# Patient Record
Sex: Female | Born: 1935 | Race: White | Hispanic: No | Smoking: Never smoker
Health system: Southern US, Community
[De-identification: ages and names within clinical notes are randomized; demographics above are authoritative.]

## PROBLEM LIST (undated history)

## (undated) DIAGNOSIS — M25569 Pain in unspecified knee: Secondary | ICD-10-CM

## (undated) DIAGNOSIS — I10 Essential (primary) hypertension: Secondary | ICD-10-CM

## (undated) HISTORY — PX: NO PAST SURGERIES: SHX2092

---

## 2017-07-09 ENCOUNTER — Ambulatory Visit
Admission: RE | Admit: 2017-07-09 | Discharge: 2017-07-09 | Disposition: A | Discharge status: Home / Self Care | Payer: Medicare Other | Source: Ambulatory Visit | Attending: Family Medicine | Admitting: Family Medicine

## 2017-07-09 ENCOUNTER — Other Ambulatory Visit: Payer: Self-pay | Admitting: Family Medicine

## 2017-07-09 DIAGNOSIS — M25561 Pain in right knee: Secondary | ICD-10-CM | POA: Diagnosis present

## 2017-07-09 DIAGNOSIS — M25461 Effusion, right knee: Secondary | ICD-10-CM | POA: Diagnosis not present

## 2017-07-09 DIAGNOSIS — M170 Bilateral primary osteoarthritis of knee: Secondary | ICD-10-CM | POA: Insufficient documentation

## 2017-07-09 DIAGNOSIS — M8580 Other specified disorders of bone density and structure, unspecified site: Secondary | ICD-10-CM | POA: Insufficient documentation

## 2017-07-09 DIAGNOSIS — G8929 Other chronic pain: Secondary | ICD-10-CM

## 2017-07-09 DIAGNOSIS — M25562 Pain in left knee: Principal | ICD-10-CM

## 2018-06-13 ENCOUNTER — Emergency Department: Payer: Medicare Other

## 2018-06-13 ENCOUNTER — Encounter: Payer: Self-pay | Admitting: Emergency Medicine

## 2018-06-13 ENCOUNTER — Other Ambulatory Visit: Payer: Self-pay

## 2018-06-13 ENCOUNTER — Inpatient Hospital Stay: Payer: Medicare Other

## 2018-06-13 ENCOUNTER — Inpatient Hospital Stay
Admission: EM | Admit: 2018-06-13 | Discharge: 2018-06-17 | DRG: 470 | Disposition: A | Discharge status: Skilled Nursing Facility | Payer: Medicare Other | Attending: Internal Medicine | Admitting: Internal Medicine

## 2018-06-13 DIAGNOSIS — F0390 Unspecified dementia without behavioral disturbance: Secondary | ICD-10-CM | POA: Diagnosis present

## 2018-06-13 DIAGNOSIS — Z781 Physical restraint status: Secondary | ICD-10-CM | POA: Diagnosis not present

## 2018-06-13 DIAGNOSIS — H9190 Unspecified hearing loss, unspecified ear: Secondary | ICD-10-CM | POA: Diagnosis present

## 2018-06-13 DIAGNOSIS — G8929 Other chronic pain: Secondary | ICD-10-CM | POA: Diagnosis present

## 2018-06-13 DIAGNOSIS — R7301 Impaired fasting glucose: Secondary | ICD-10-CM | POA: Diagnosis present

## 2018-06-13 DIAGNOSIS — N390 Urinary tract infection, site not specified: Secondary | ICD-10-CM | POA: Diagnosis present

## 2018-06-13 DIAGNOSIS — Z836 Family history of other diseases of the respiratory system: Secondary | ICD-10-CM | POA: Diagnosis not present

## 2018-06-13 DIAGNOSIS — M25552 Pain in left hip: Secondary | ICD-10-CM | POA: Diagnosis present

## 2018-06-13 DIAGNOSIS — S72009A Fracture of unspecified part of neck of unspecified femur, initial encounter for closed fracture: Secondary | ICD-10-CM

## 2018-06-13 DIAGNOSIS — S72012A Unspecified intracapsular fracture of left femur, initial encounter for closed fracture: Secondary | ICD-10-CM | POA: Diagnosis present

## 2018-06-13 DIAGNOSIS — E860 Dehydration: Secondary | ICD-10-CM | POA: Diagnosis present

## 2018-06-13 DIAGNOSIS — M25511 Pain in right shoulder: Secondary | ICD-10-CM | POA: Diagnosis present

## 2018-06-13 DIAGNOSIS — I10 Essential (primary) hypertension: Secondary | ICD-10-CM | POA: Diagnosis present

## 2018-06-13 DIAGNOSIS — W19XXXA Unspecified fall, initial encounter: Secondary | ICD-10-CM

## 2018-06-13 DIAGNOSIS — M1612 Unilateral primary osteoarthritis, left hip: Secondary | ICD-10-CM | POA: Diagnosis present

## 2018-06-13 DIAGNOSIS — Y92003 Bedroom of unspecified non-institutional (private) residence as the place of occurrence of the external cause: Secondary | ICD-10-CM | POA: Diagnosis not present

## 2018-06-13 DIAGNOSIS — Z79899 Other long term (current) drug therapy: Secondary | ICD-10-CM

## 2018-06-13 DIAGNOSIS — G8918 Other acute postprocedural pain: Secondary | ICD-10-CM

## 2018-06-13 DIAGNOSIS — W1809XA Striking against other object with subsequent fall, initial encounter: Secondary | ICD-10-CM | POA: Diagnosis present

## 2018-06-13 DIAGNOSIS — I447 Left bundle-branch block, unspecified: Secondary | ICD-10-CM | POA: Diagnosis present

## 2018-06-13 DIAGNOSIS — S72002A Fracture of unspecified part of neck of left femur, initial encounter for closed fracture: Secondary | ICD-10-CM | POA: Diagnosis present

## 2018-06-13 HISTORY — DX: Pain in unspecified knee: M25.569

## 2018-06-13 HISTORY — DX: Essential (primary) hypertension: I10

## 2018-06-13 LAB — HEMOGLOBIN A1C
Hgb A1c MFr Bld: 5.2 % (ref 4.8–5.6)
Mean Plasma Glucose: 102.54 mg/dL

## 2018-06-13 LAB — BASIC METABOLIC PANEL
Anion gap: 10 (ref 5–15)
BUN: 29 mg/dL — AB (ref 8–23)
CALCIUM: 9.2 mg/dL (ref 8.9–10.3)
CO2: 23 mmol/L (ref 22–32)
CREATININE: 1.01 mg/dL — AB (ref 0.44–1.00)
Chloride: 104 mmol/L (ref 98–111)
GFR calc Af Amer: 52 mL/min — ABNORMAL LOW (ref >60)
GFR, EST NON AFRICAN AMERICAN: 44 mL/min — AB (ref >60)
Glucose, Bld: 140 mg/dL — ABNORMAL HIGH (ref 70–99)
Potassium: 4.2 mmol/L (ref 3.5–5.1)
SODIUM: 137 mmol/L (ref 135–145)

## 2018-06-13 LAB — CBC
HCT: 37.2 % (ref 36.0–46.0)
Hemoglobin: 12.1 g/dL (ref 12.0–15.0)
MCH: 31.4 pg (ref 26.0–34.0)
MCHC: 32.5 g/dL (ref 30.0–36.0)
MCV: 96.6 fL (ref 80.0–100.0)
NRBC: 0 % (ref 0.0–0.2)
PLATELETS: 225 10*3/uL (ref 150–400)
RBC: 3.85 MIL/uL — AB (ref 3.87–5.11)
RDW: 12.7 % (ref 11.5–15.5)
WBC: 12.4 10*3/uL — ABNORMAL HIGH (ref 4.0–10.5)

## 2018-06-13 LAB — TYPE AND SCREEN
ABO/RH(D): O POS
Antibody Screen: NEGATIVE

## 2018-06-13 LAB — PROTIME-INR
INR: 1
PROTHROMBIN TIME: 13.1 s (ref 11.4–15.2)

## 2018-06-13 LAB — APTT: aPTT: 38 seconds — ABNORMAL HIGH (ref 24–36)

## 2018-06-13 MED ORDER — SODIUM CHLORIDE 0.9 % IV BOLUS
1000.0000 mL | Freq: Once | INTRAVENOUS | Status: AC
  Administered 2018-06-13: 1000 mL via INTRAVENOUS

## 2018-06-13 MED ORDER — MORPHINE SULFATE (PF) 2 MG/ML IV SOLN
1.0000 mg | INTRAVENOUS | Status: DC | PRN
  Administered 2018-06-13: 1 mg via INTRAVENOUS
  Filled 2018-06-13: qty 1

## 2018-06-13 MED ORDER — CEFAZOLIN SODIUM-DEXTROSE 1-4 GM/50ML-% IV SOLN
1.0000 g | Freq: Once | INTRAVENOUS | Status: AC
  Administered 2018-06-14: 1 g via INTRAVENOUS
  Filled 2018-06-13: qty 50

## 2018-06-13 MED ORDER — OXYCODONE HCL 5 MG PO TABS
5.0000 mg | ORAL_TABLET | ORAL | Status: DC | PRN
  Administered 2018-06-16 – 2018-06-17 (×3): 5 mg via ORAL
  Filled 2018-06-13 (×3): qty 1

## 2018-06-13 MED ORDER — ONDANSETRON HCL 4 MG/2ML IJ SOLN: 4.0000 mg | Freq: Four times a day (QID) | INTRAMUSCULAR | Status: DC | PRN

## 2018-06-13 MED ORDER — OCUVITE-LUTEIN PO CAPS
1.0000 | ORAL_CAPSULE | Freq: Every day | ORAL | Status: DC
  Administered 2018-06-16 – 2018-06-17 (×2): 1 via ORAL
  Filled 2018-06-13 (×4): qty 1

## 2018-06-13 MED ORDER — ACETAMINOPHEN 650 MG RE SUPP: 650.0000 mg | Freq: Four times a day (QID) | RECTAL | Status: DC | PRN

## 2018-06-13 MED ORDER — ONDANSETRON HCL 4 MG PO TABS: 4.0000 mg | ORAL_TABLET | Freq: Four times a day (QID) | ORAL | Status: DC | PRN

## 2018-06-13 MED ORDER — FENTANYL CITRATE (PF) 100 MCG/2ML IJ SOLN
25.0000 ug | INTRAMUSCULAR | Status: DC | PRN
  Administered 2018-06-13: 25 ug via INTRAVENOUS
  Filled 2018-06-13: qty 2

## 2018-06-13 MED ORDER — ACETAMINOPHEN 325 MG PO TABS
650.0000 mg | ORAL_TABLET | Freq: Four times a day (QID) | ORAL | Status: DC | PRN
  Administered 2018-06-13 – 2018-06-15 (×2): 650 mg via ORAL
  Filled 2018-06-13 (×3): qty 2

## 2018-06-13 MED ORDER — SODIUM CHLORIDE 0.9 % IV SOLN
INTRAVENOUS | Status: DC
  Administered 2018-06-13 – 2018-06-15 (×2): via INTRAVENOUS

## 2018-06-13 MED ORDER — ATENOLOL 25 MG PO TABS
25.0000 mg | ORAL_TABLET | Freq: Every day | ORAL | Status: DC
  Administered 2018-06-14 – 2018-06-17 (×3): 25 mg via ORAL
  Filled 2018-06-13 (×4): qty 1

## 2018-06-13 NOTE — ED Provider Notes (Signed)
[facility] Emergency Department Provider Note  ____________________________________________  Time seen: Approximately 2:00 PM  I have reviewed the triage vital signs and the nursing notes.   HISTORY  Chief Complaint Fall    HPI Jill Henderson is a 84 y.o. female with a history of HTN, not anticoagulated and not on aspirin, presenting with left hip pain after mechanical fall.  The patient lives independently with a daytime home health aide.  She got up prior to her aide arriving, was using her walker to get around the bed when the walker got caught into the leg of the bed and she fell onto her left side.  She did not hit her head or lose consciousness but was unable to stand.  She has pain in the left hip.  Since she has been here, she has developed some pain in the right shoulder; at baseline she has severe osteoarthritis with pain in the shoulder and feels it is similar to that.  She denies any headache, nausea or vomiting.  She has not had any chest pain, shortness of breath, lightheadedness or syncope, palpitations, numbness tingling or weakness, visual changes, speech changes or mental status changes.  She has not had any recent illnesses.  She has not eaten since yesterday.  Past Medical History:  Diagnosis Date  . Hypertension     There are no active problems to display for this patient.   History reviewed. No pertinent surgical history.    Allergies Patient has no known allergies.  No family history on file.  Social History Social History   Tobacco Use  . Smoking status: Never Smoker  . Smokeless tobacco: Never Used  Substance Use Topics  . Alcohol use: Never    Frequency: Never  . Drug use: Never    Review of Systems Constitutional: No fever/chills.  Has a mechanical fall.  No loss of consciousness, lightheadedness or syncope. Eyes: No visual changes.  No blurred or double vision. ENT: No sore throat. No congestion or  rhinorrhea. Cardiovascular: Denies chest pain. Denies palpitations. Respiratory: Denies shortness of breath.  No cough. Gastrointestinal: No abdominal pain.  No nausea, no vomiting.  No diarrhea.  No constipation. Genitourinary: Negative for dysuria. Musculoskeletal: Negative for back pain.  Chronic right shoulder pain, worse at this time.  Positive left hip pain.  No neck pain. Skin: Negative for rash. Neurological: Negative for headaches. No focal numbness, tingling or weakness.     ____________________________________________   PHYSICAL EXAM:  VITAL SIGNS: ED Triage Vitals [06/13/18 1247]  Enc Vitals Group     BP 107/76     Pulse Rate (!) 54     Resp 16     Temp      Temp src      SpO2 93 %     Weight 123 lb (55.8 kg)     Height      Head Circumference      Peak Flow      Pain Score      Pain Loc      Pain Edu?      Excl. in GC?     Constitutional: Alert and oriented. Answers questions appropriately.  GCS is 15. Eyes: Conjunctivae are normal.  EOMI. PERRLA.  No scleral icterus.  No raccoon eyes. Head: Atraumatic.  No battle sign. Nose: No congestion/rhinnorhea.  No swelling over the nose or septal hematoma. Mouth/Throat: Mucous membranes are moist.  No dental injury or malocclusion. Neck: No stridor.  Some neck  stiffness which is appropriate for age.  No midline C-spine tenderness to palpation, step-offs or deformities. Cardiovascular: Normal rate, regular rhythm. No murmurs, rubs or gallops.  Respiratory: Normal respiratory effort.  No accessory muscle use or retractions. Lungs CTAB.  No wheezes, rales or ronchi. Gastrointestinal: Soft, nontender and nondistended.  No guarding or rebound.  No peritoneal signs. Musculoskeletal: Pelvis is stable.  Full range of motion of the right ankle, knee and hip without pain.  Tenderness palpation over the left greater trochanter.  Normal DP and PT pulses bilaterally.  Normal sensation to light touch bilaterally.  No LE edema. No  ttp in the calves or palpable cords.  Negative Homan's sign. Neurologic: alert.  Speech is clear.  Face and smile are symmetric.  EOMI.  Moves all extremities well. Skin:  Skin is warm, dry and intact. No rash noted. Psychiatric: Mood and affect are normal. Speech and behavior are normal.  Normal judgement.  ____________________________________________   LABS (all labs ordered are listed, but only abnormal results are displayed)  Labs Reviewed  CBC  BASIC METABOLIC PANEL  PROTIME-INR  APTT  TYPE AND SCREEN   ____________________________________________  EKG  ED ECG REPORT I, Anne-Caroline Sharma Covert, the attending physician, personally viewed and interpreted this ECG.   Date: 06/13/2018  EKG Time: 1303  Rate: 57  Rhythm: sinus bradycardia; LBBB  Axis: leftward  Intervals:none  ST&T Change: No STEMI  ____________________________________________  RADIOLOGY  Dg Chest 1 View  Result Date: 06/13/2018 CLINICAL DATA:  Pain after fall. EXAM: CHEST  1 VIEW COMPARISON:  None. FINDINGS: Borderline cardiomegaly with moderate aortic atherosclerosis. The aorta is uncoiled in appearance. Coarse interstitial lung markings are noted bilaterally without alveolar consolidation, pulmonary consolidation, effusion or pneumothorax. Osteoarthritis with remodeled appearance of the right humeral head and glenoid. Osteoarthritis of the included AC joints. No acute rib fracture. IMPRESSION: Cardiomegaly with tortuous atherosclerotic aorta. No active pulmonary disease. Electronically Signed   By: Tollie Eth M.D.   On: 06/13/2018 13:41   Dg Hip Unilat With Pelvis 2-3 Views Left  Result Date: 06/13/2018 CLINICAL DATA:  Left hip pain after fall EXAM: DG HIP (WITH OR WITHOUT PELVIS) 2-3V LEFT COMPARISON:  None. FINDINGS: Varus angulated subcapital left femoral neck fracture is identified. No joint dislocation. Degenerative joint space narrowing of both hips left greater than right. Lower lumbar facet arthrosis  at L5-S1. No acute pelvic fracture. Increased stool burden noted within the rectum. IMPRESSION: 1. Varus angulated subcapital left femoral neck fracture with bilateral hip joint osteoarthritis. 2. Facet arthrosis L5-S1 bilaterally. 3. No acute pelvic fracture. Electronically Signed   By: Tollie Eth M.D.   On: 06/13/2018 13:40    ____________________________________________   PROCEDURES  Procedure(s) performed: None  Procedures  Critical Care performed: No ____________________________________________   INITIAL IMPRESSION / ASSESSMENT AND PLAN / ED COURSE  Pertinent labs & imaging results that were available during my care of the patient were reviewed by me and considered in my medical decision making (see chart for details).  84 y.o. female status post mechanical fall with left hip pain.  Overall, the patient is hemodynamically stable; she does have some bradycardia and we will do a EKG for further evaluation.  The patient's x-ray does show a left femoral neck fracture and I have put a consult into Dr. Rosita Kea for evaluation.  The patient will be admitted to the hospitalist.  ____________________________________________  FINAL CLINICAL IMPRESSION(S) / ED DIAGNOSES  Final diagnoses:  Closed fracture of neck of left femur, initial  encounter [facility])  Acute pain of right shoulder  Fall, initial encounter         NEW MEDICATIONS STARTED DURING THIS VISIT:  New Prescriptions   No medications on file      Rockne Menghini, MD 06/13/18 1514

## 2018-06-13 NOTE — ED Triage Notes (Signed)
Pt to ED via [facility] from home for fall. Pt was found by aid this morning when she went to check on pt. Pt has obvious shortening and rotation of the left leg. Pts Vitals per EMS as follows  BP: 150/76 HR: 58- regular SpO2: 97% on room air CBG: 182

## 2018-06-13 NOTE — Clinical Social Work Placement (Signed)
   CLINICAL SOCIAL WORK PLACEMENT  NOTE  Date:  06/13/2018  Patient Details  Name: Jill Henderson MRN: [id number] Date of Birth: 03-31-1917  Clinical Social Work is seeking post-discharge placement for this patient at the Skilled  Nursing Facility level of care (*CSW will initial, date and re-position this form in  chart as items are completed):  Yes   Patient/family provided with [place] Clinical Social Work Department's list of facilities offering this level of care within the geographic area requested by the patient (or if unable, by the patient's family).  Yes   Patient/family informed of their freedom to choose among providers that offer the needed level of care, that participate in Medicare, Medicaid or managed care program needed by the patient, have an available bed and are willing to accept the patient.  Yes   Patient/family informed of [facility]'s ownership interest in [facility] and [facility], as well as of the fact that they are under no obligation to receive care at these facilities.  PASRR submitted to EDS on 06/13/18     PASRR number received on 06/13/18     Existing PASRR number confirmed on       FL2 transmitted to all facilities in geographic area requested by pt/family on 06/13/18     FL2 transmitted to all facilities within larger geographic area on       Patient informed that his/her managed care company has contracts with or will negotiate with certain facilities, including the following:            Patient/family informed of bed offers received.  Patient chooses bed at       Physician recommends and patient chooses bed at      Patient to be transferred to   on  .  Patient to be transferred to facility by       Patient family notified on   of transfer.  Name of family member notified:        PHYSICIAN       Additional Comment:    _______________________________________________ Ruthe Mannan, LCSWA 06/13/2018, 5:08 PM

## 2018-06-13 NOTE — Clinical Social Work Note (Signed)
Clinical Social Work Assessment  Patient Details  Name: Jill Henderson MRN: [id number] Date of Birth: 05/18/1917  Date of referral:  06/13/18               Reason for consult:  Facility Placement                Permission sought to share information with:  Oceanographer granted to share information::  Yes, Verbal Permission Granted  Name::      Skilled Nursing Facility   Agency::   [place]   Relationship::     Contact Information:     Housing/Transportation Living arrangements for the past 2 months:  Single Family Home Source of Information:  Other (Comment Required)(granddaughter ) Patient Interpreter Needed:  None Criminal Activity/Legal Involvement Pertinent to Current Situation/Hospitalization:  No - Comment as needed Significant Relationships:  Other Family Members Lives with:  Self Do you feel safe going back to the place where you live?    Need for family participation in patient care:  Yes (Comment)  Care giving concerns:  Patient lives alone in [place].    Social Worker assessment / plan:  Visual merchandiser (CSW) reviewed chart and noted that patient has a hip fracture. Ortho consult is pending. CSW attempted to meet with patient alone at bedside however she was pleasantly confused. CSW contacted patient's granddaughter Jill Henderson. Per Jill Henderson she lives in [place] and is driving up to [place] [place]. Per Jill Henderson she and her 2 brothers Jill Henderson and Jill Henderson will make decisions for patient. Per Jill Henderson patient lives alone in [place] and has private duty caregivers that come in during the day. Per Jill Henderson all her grandchildren live in [place]. CSW explained that PT will evaluate patient and make a recommendation of home health or SNF. CSW also explained that medicare requires a 3 night qualifying inpatient stay in the hospital in order to pay for SNF. Jill Henderson verbalized her understanding. CSW will continue to follow and assist as needed.   Employment status:  Disabled  (Comment on whether or not currently receiving Disability) Insurance information:  Medicare PT Recommendations:  Not assessed at this time Information / Referral to community resources:  Skilled Nursing Facility  Patient/Family's Response to care:  Per granddaughter she is waiting on Ortho surgeon's recommendations.   Patient/Family's Understanding of and Emotional Response to Diagnosis, Current Treatment, and Prognosis:  Patient and her granddaughter were very pleasant and thanked CSW for assistance.   Emotional Assessment Appearance:  Appears stated age Attitude/Demeanor/Rapport:    Affect (typically observed):  Unable to Assess Orientation:  Oriented to Self, Fluctuating Orientation (Suspected and/or reported Sundowners) Alcohol / Substance use:  Not Applicable Psych involvement (Current and /or in the community):  No (Comment)  Discharge Needs  Concerns to be addressed:  Discharge Planning Concerns Readmission within the last 30 days:  No Current discharge risk:  Dependent with Mobility Barriers to Discharge:  Continued Medical Work up   [employer], Jill Crocker, LCSW 06/13/2018, 5:18 PM

## 2018-06-13 NOTE — Consult Note (Signed)
Patient is a 84 year old female who lives alone who apparently suffered a fall today at home when getting out of bed.  She normally uses a walker and she says it just flipped over and she fell onto her left side.  She has been living by herself with aides coming in.  She normally uses a walker to get around her home.  On exam she is able to flex extend the toes on the left side and the left leg is shortened and externally rotated.  Skin is intact around the hip without ecchymosis.  Radiographs show completely displaced femoral neck fracture with moderate arthritis  Impression is displaced femoral neck fracture  Daryl Eastern is left hip hemiarthroplasty.  Risks and possible complications discussed.

## 2018-06-13 NOTE — Progress Notes (Signed)
Patient ID: Jill Henderson, female   DOB: 11-Jan-1917, 84 y.o.   MRN: [id number]  ACP note  Patient present.  Permission to speak in front of neighbor and caregiver.  Diagnosis: Acute left hip fracture, hypertension, dehydration and impaired fasting glucose.  CODE STATUS discussed.  Patient wishes to be a full code at this time.  She would not want to be on a machine long-term.  Took a while to talk about this secondary to the patient's poor hearing  Plan.  Orthopedic surgery consultation for hip fracture repair.  Pain control.  Most likely patient will end up needing rehab.  Time spent on ACP discussion 17 minutes Dr. Alford Highland

## 2018-06-13 NOTE — NC FL2 (Signed)
  [place] MEDICAID FL2 LEVEL OF CARE SCREENING TOOL     IDENTIFICATION  Patient Name: Jill Henderson Birthdate: May 03, 1917 Sex: female Admission Date (Current Location): 06/13/2018  [place] and [place] Number:  Chiropodist and Address:  [facility], [address] [place], [place] [address]      Provider Number: [id number]  Attending Physician Name and Address:  Alford Highland, MD  Relative Name and Phone Number:  Cristal Ford- granddaughter [phone]    Current Level of Care: Hospital Recommended Level of Care: Skilled Nursing Facility Prior Approval Number:    Date Approved/Denied:   PASRR Number: [id number]  Discharge Plan: SNF    Current Diagnoses: Patient Active Problem List   Diagnosis Date Noted  . Closed left hip fracture (HCC) 06/13/2018    Orientation RESPIRATION BLADDER Height & Weight     Self  Normal Incontinent Weight: 123 lb (55.8 kg) Height:     BEHAVIORAL SYMPTOMS/MOOD NEUROLOGICAL BOWEL NUTRITION STATUS  (none) (none) Continent Diet(NPO to be advanced )  AMBULATORY STATUS COMMUNICATION OF NEEDS Skin   Extensive Assist Verbally Normal                       Personal Care Assistance Level of Assistance  Bathing, Feeding, Dressing Bathing Assistance: Limited assistance Feeding assistance: Independent Dressing Assistance: Limited assistance     Functional Limitations Info  Sight, Hearing, Speech Sight Info: Adequate Hearing Info: Adequate Speech Info: Adequate    SPECIAL CARE FACTORS FREQUENCY  PT (By licensed PT), OT (By licensed OT)     PT Frequency: 5 OT Frequency: 5            Contractures Contractures Info: Not present    Additional Factors Info  Code Status, Allergies Code Status Info: Full Code  Allergies Info: NKA           Current Medications (06/13/2018):  This is the current hospital active medication list Current Facility-Administered Medications  Medication Dose  Route Frequency Provider Last Rate Last Dose  . 0.9 %  sodium chloride infusion   Intravenous Continuous Alford Highland, MD 20 mL/hr at 06/13/18 1602    . acetaminophen (TYLENOL) tablet 650 mg  650 mg Oral Q6H PRN Alford Highland, MD       Or  . acetaminophen (TYLENOL) suppository 650 mg  650 mg Rectal Q6H PRN Alford Highland, MD      . Melene Muller ON 06/14/2018] atenolol (TENORMIN) tablet 25 mg  25 mg Oral Daily Wieting, Richard, MD      . morphine 2 MG/ML injection 1 mg  1 mg Intravenous Q3H PRN Alford Highland, MD   1 mg at 06/13/18 1615  . multivitamin-lutein (OCUVITE-LUTEIN) capsule 1 capsule  1 capsule Oral Daily Wieting, Richard, MD      . ondansetron [facility]) tablet 4 mg  4 mg Oral Q6H PRN Wieting, Richard, MD       Or  . ondansetron (ZOFRAN) injection 4 mg  4 mg Intravenous Q6H PRN Wieting, Richard, MD      . oxyCODONE (Oxy IR/ROXICODONE) immediate release tablet 5 mg  5 mg Oral Q4H PRN Alford Highland, MD         Discharge Medications: Please see discharge summary for a list of discharge medications.  Relevant Imaging Results:  Relevant Lab Results:   Additional Information SSN: [id number]  Ruthe Mannan, [place]

## 2018-06-13 NOTE — H&P (Signed)
[facility] [place] [facility]   PATIENT NAME: Jill Henderson    MR#:  [id number]  DATE OF BIRTH:  23-May-1917  DATE OF ADMISSION:  06/13/2018  PRIMARY CARE PHYSICIAN: Oswaldo Conroy, MD   REQUESTING/REFERRING PHYSICIAN: Dr Virgilio Frees  CHIEF COMPLAINT:   Chief Complaint  Patient presents with  . Fall    HISTORY OF PRESENT ILLNESS:  Jill Henderson  is a 84 y.o. female presents after a fall today.  She states that she got up earlier and got out of bed and her knee was hurting and she was trying to use her walker and the walker hit the edge of the bed and she fell to the ground.  In the ER she was found to have a hip fracture.  Patient having severe pain in the left hip.  She lives alone.  She does have caregivers.  No complaints of chest pain or shortness of breath.  PAST MEDICAL HISTORY:   Past Medical History:  Diagnosis Date  . Hypertension   . Knee pain     PAST SURGICAL HISTORY:   Past Surgical History:  Procedure Laterality Date  . NO PAST SURGERIES      SOCIAL HISTORY:   Social History   Tobacco Use  . Smoking status: Never Smoker  . Smokeless tobacco: Never Used  Substance Use Topics  . Alcohol use: Never    Frequency: Never    FAMILY HISTORY:   Family History  Problem Relation Age of Onset  . Other Mother        Influenza    DRUG ALLERGIES:  No Known Allergies  REVIEW OF SYSTEMS:  CONSTITUTIONAL: No fever, fatigue or weakness.  EYES: No blurred or double vision.  Wears glasses EARS, NOSE, AND THROAT: No tinnitus or ear pain. No sore throat.  Poor hearing RESPIRATORY: No cough, shortness of breath, wheezing or hemoptysis.  CARDIOVASCULAR: No chest pain, orthopnea, edema.  GASTROINTESTINAL: No nausea, vomiting, diarrhea or abdominal pain. No blood in bowel movements GENITOURINARY: No dysuria, hematuria.  ENDOCRINE: No polyuria, nocturia,  HEMATOLOGY: No anemia, easy bruising or bleeding SKIN: No rash or  lesion. MUSCULOSKELETAL: Left hip pain.  Some shoulder pain NEUROLOGIC: No tingling, numbness, weakness.  PSYCHIATRY: No anxiety or depression.   MEDICATIONS AT HOME:   Prior to Admission medications   Medication Sig Start Date End Date Taking? Authorizing Provider  acetaminophen (TYLENOL 8 HOUR ARTHRITIS PAIN) 650 MG CR tablet Take 650 mg by mouth 2 (two) times daily.   Yes [provider]  amLODipine (NORVASC) 10 MG tablet Take 10 mg by mouth daily.   Yes [provider]  atenolol (TENORMIN) 100 MG tablet Take 100 mg by mouth daily.   Yes [provider]  Glucosamine-Chondroitin (OSTEO BI-FLEX REGULAR STRENGTH) 250-200 MG TABS Take 1 tablet by mouth daily.   Yes [provider]  Multiple Vitamins-Minerals (PRESERVISION AREDS PO) Take 1 tablet by mouth daily.   Yes [provider]      VITAL SIGNS:  Blood pressure 107/76, pulse (!) 54, resp. rate 16, weight 55.8 kg, SpO2 93 %.  PHYSICAL EXAMINATION:  GENERAL:  84 y.o.-year-old patient lying in the bed with no acute distress.  EYES: Pupils equal, round, reactive to light and accommodation. No scleral icterus. Extraocular muscles intact.  HEENT: Head atraumatic, normocephalic. Oropharynx and nasopharynx clear.  NECK:  Supple, no jugular venous distention. No thyroid enlargement, no tenderness.  LUNGS: Normal breath sounds bilaterally, no wheezing, rales,rhonchi or  crepitation. No use of accessory muscles of respiration.  CARDIOVASCULAR: S1, S2 normal. No murmurs, rubs, or gallops.  ABDOMEN: Soft, nontender, nondistended. Bowel sounds present. No organomegaly or mass.  EXTREMITIES: 2+ pedal edema.  No cyanosis, or clubbing.  Left hip shortened and externally rotated. NEUROLOGIC: Cranial nerves II through XII are intact. Sensation intact. Gait not checked.  PSYCHIATRIC: The patient is alert and answers questions appropriately.  Patient has difficulty hearing so I think that is part of the issue.   SKIN: No rash, lesion, or ulcer.   LABORATORY PANEL:   CBC Recent Labs  Lab 06/13/18 1254  WBC 12.4*  HGB 12.1  HCT 37.2  PLT 225   ------------------------------------------------------------------------------------------------------------------  Chemistries  Recent Labs  Lab 06/13/18 1254  NA 137  K 4.2  CL 104  CO2 23  GLUCOSE 140*  BUN 29*  CREATININE 1.01*  CALCIUM 9.2   ------------------------------------------------------------------------------------------------------------------    RADIOLOGY:  Dg Chest 1 View  Result Date: 06/13/2018 CLINICAL DATA:  Pain after fall. EXAM: CHEST  1 VIEW COMPARISON:  None. FINDINGS: Borderline cardiomegaly with moderate aortic atherosclerosis. The aorta is uncoiled in appearance. Coarse interstitial lung markings are noted bilaterally without alveolar consolidation, pulmonary consolidation, effusion or pneumothorax. Osteoarthritis with remodeled appearance of the right humeral head and glenoid. Osteoarthritis of the included AC joints. No acute rib fracture. IMPRESSION: Cardiomegaly with tortuous atherosclerotic aorta. No active pulmonary disease. Electronically Signed   By: Tollie Eth M.D.   On: 06/13/2018 13:41   Dg Hip Unilat With Pelvis 2-3 Views Left  Result Date: 06/13/2018 CLINICAL DATA:  Left hip pain after fall EXAM: DG HIP (WITH OR WITHOUT PELVIS) 2-3V LEFT COMPARISON:  None. FINDINGS: Varus angulated subcapital left femoral neck fracture is identified. No joint dislocation. Degenerative joint space narrowing of both hips left greater than right. Lower lumbar facet arthrosis at L5-S1. No acute pelvic fracture. Increased stool burden noted within the rectum. IMPRESSION: 1. Varus angulated subcapital left femoral neck fracture with bilateral hip joint osteoarthritis. 2. Facet arthrosis L5-S1 bilaterally. 3. No acute pelvic fracture. Electronically Signed   By: Tollie Eth M.D.   On: 06/13/2018 13:40    EKG:   Ordered by  me  IMPRESSION AND PLAN:   1.  Preoperative evaluation for left hip fracture closed initial evaluation.  No contraindications to surgery at this time.  Patient not having any chest pain or shortness of breath. 2.  Hypertension.  Blood pressure on the lower side.  Hold Norvasc and cut back the dose of atenolol. 3.  Dehydration give very gentle IV fluids. 4.  Impaired fasting glucose check a hemoglobin A1c   All the records are reviewed and case discussed with ED provider. Management plans discussed with the patient, family and they are in agreement.  CODE STATUS: full code  TOTAL TIME TAKING CARE OF THIS PATIENT: 50 minutes.    Alford Highland M.D on 06/13/2018 at 2:44 PM  Between 7am to 6pm - Pager - [phone]  After 6pm call admission pager [phone]  [facility] Office  [phone]  CC: Primary care physician; Oswaldo Conroy, MD

## 2018-06-14 ENCOUNTER — Encounter: Payer: Self-pay | Admitting: Anesthesiology

## 2018-06-14 ENCOUNTER — Inpatient Hospital Stay: Payer: Medicare Other | Admitting: Anesthesiology

## 2018-06-14 ENCOUNTER — Inpatient Hospital Stay: Payer: Medicare Other

## 2018-06-14 ENCOUNTER — Encounter
Admission: EM | Disposition: A | Discharge status: Skilled Nursing Facility | Payer: Self-pay | Source: Home / Self Care | Attending: Internal Medicine

## 2018-06-14 HISTORY — PX: HIP ARTHROPLASTY: SHX981

## 2018-06-14 LAB — CBC
HEMATOCRIT: 35.4 % — AB (ref 36.0–46.0)
HEMOGLOBIN: 11.8 g/dL — AB (ref 12.0–15.0)
MCH: 31.9 pg (ref 26.0–34.0)
MCHC: 33.3 g/dL (ref 30.0–36.0)
MCV: 95.7 fL (ref 80.0–100.0)
NRBC: 0 % (ref 0.0–0.2)
Platelets: 196 10*3/uL (ref 150–400)
RBC: 3.7 MIL/uL — AB (ref 3.87–5.11)
RDW: 12.6 % (ref 11.5–15.5)
WBC: 12.3 10*3/uL — AB (ref 4.0–10.5)

## 2018-06-14 LAB — BASIC METABOLIC PANEL
Anion gap: 11 (ref 5–15)
BUN: 24 mg/dL — ABNORMAL HIGH (ref 8–23)
CHLORIDE: 105 mmol/L (ref 98–111)
CO2: 21 mmol/L — ABNORMAL LOW (ref 22–32)
Calcium: 8.9 mg/dL (ref 8.9–10.3)
Creatinine, Ser: 0.95 mg/dL (ref 0.44–1.00)
GFR calc non Af Amer: 48 mL/min — ABNORMAL LOW (ref >60)
GFR, EST AFRICAN AMERICAN: 56 mL/min — AB (ref >60)
Glucose, Bld: 117 mg/dL — ABNORMAL HIGH (ref 70–99)
POTASSIUM: 3.3 mmol/L — AB (ref 3.5–5.1)
SODIUM: 137 mmol/L (ref 135–145)

## 2018-06-14 LAB — SURGICAL PCR SCREEN
MRSA, PCR: NEGATIVE
STAPHYLOCOCCUS AUREUS: NEGATIVE

## 2018-06-14 SURGERY — HEMIARTHROPLASTY, HIP, DIRECT ANTERIOR APPROACH, FOR FRACTURE
Anesthesia: Spinal | Laterality: Left

## 2018-06-14 MED ORDER — DIPHENHYDRAMINE HCL 12.5 MG/5ML PO ELIX: 12.5000 mg | ORAL_SOLUTION | ORAL | Status: DC | PRN

## 2018-06-14 MED ORDER — METHOCARBAMOL 1000 MG/10ML IJ SOLN
500.0000 mg | Freq: Four times a day (QID) | INTRAVENOUS | Status: DC | PRN
  Filled 2018-06-14: qty 5

## 2018-06-14 MED ORDER — ONDANSETRON HCL 4 MG/2ML IJ SOLN: 4.0000 mg | Freq: Four times a day (QID) | INTRAMUSCULAR | Status: DC | PRN

## 2018-06-14 MED ORDER — PROPOFOL 10 MG/ML IV BOLUS
INTRAVENOUS | Status: DC | PRN
  Administered 2018-06-14: 10 mg via INTRAVENOUS

## 2018-06-14 MED ORDER — FENTANYL CITRATE (PF) 100 MCG/2ML IJ SOLN: 25.0000 ug | INTRAMUSCULAR | Status: DC | PRN

## 2018-06-14 MED ORDER — CEFAZOLIN SODIUM-DEXTROSE 1-4 GM/50ML-% IV SOLN
1.0000 g | Freq: Four times a day (QID) | INTRAVENOUS | Status: AC
  Administered 2018-06-14 – 2018-06-15 (×2): 1 g via INTRAVENOUS
  Filled 2018-06-14 (×2): qty 50

## 2018-06-14 MED ORDER — MAGNESIUM CITRATE PO SOLN
1.0000 | Freq: Once | ORAL | Status: DC | PRN
  Filled 2018-06-14: qty 296

## 2018-06-14 MED ORDER — NEOMYCIN-POLYMYXIN B GU 40-200000 IR SOLN
Status: DC | PRN
  Administered 2018-06-14: 2 mL

## 2018-06-14 MED ORDER — ALUM & MAG HYDROXIDE-SIMETH 200-200-20 MG/5ML PO SUSP: 30.0000 mL | ORAL | Status: DC | PRN

## 2018-06-14 MED ORDER — FENTANYL CITRATE (PF) 100 MCG/2ML IJ SOLN
INTRAMUSCULAR | Status: DC | PRN
  Administered 2018-06-14: 25 ug via INTRAVENOUS

## 2018-06-14 MED ORDER — SODIUM CHLORIDE 0.9 % IV SOLN
INTRAVENOUS | Status: DC | PRN
  Administered 2018-06-14: 25 ug/min via INTRAVENOUS

## 2018-06-14 MED ORDER — EPINEPHRINE PF 1 MG/ML IJ SOLN
INTRAMUSCULAR | Status: AC
  Filled 2018-06-14: qty 1

## 2018-06-14 MED ORDER — METHOCARBAMOL 500 MG PO TABS
500.0000 mg | ORAL_TABLET | Freq: Four times a day (QID) | ORAL | Status: DC | PRN
  Administered 2018-06-15 – 2018-06-16 (×2): 500 mg via ORAL
  Filled 2018-06-14 (×3): qty 1

## 2018-06-14 MED ORDER — DOCUSATE SODIUM 100 MG PO CAPS
100.0000 mg | ORAL_CAPSULE | Freq: Two times a day (BID) | ORAL | Status: DC
  Administered 2018-06-14 – 2018-06-17 (×5): 100 mg via ORAL
  Filled 2018-06-14 (×7): qty 1

## 2018-06-14 MED ORDER — BUPIVACAINE HCL (PF) 0.5 % IJ SOLN
INTRAMUSCULAR | Status: AC
  Filled 2018-06-14: qty 10

## 2018-06-14 MED ORDER — KETAMINE HCL 50 MG/ML IJ SOLN
INTRAMUSCULAR | Status: DC | PRN
  Administered 2018-06-14: 25 mg via INTRAMUSCULAR

## 2018-06-14 MED ORDER — ONDANSETRON HCL 4 MG PO TABS: 4.0000 mg | ORAL_TABLET | Freq: Four times a day (QID) | ORAL | Status: DC | PRN

## 2018-06-14 MED ORDER — ASPIRIN EC 325 MG PO TBEC
325.0000 mg | DELAYED_RELEASE_TABLET | Freq: Every day | ORAL | Status: DC
  Administered 2018-06-16 – 2018-06-17 (×2): 325 mg via ORAL
  Filled 2018-06-14 (×3): qty 1

## 2018-06-14 MED ORDER — ONDANSETRON HCL 4 MG/2ML IJ SOLN: 4.0000 mg | Freq: Once | INTRAMUSCULAR | Status: DC | PRN

## 2018-06-14 MED ORDER — PHENOL 1.4 % MT LIQD
1.0000 | OROMUCOSAL | Status: DC | PRN
  Filled 2018-06-14: qty 177

## 2018-06-14 MED ORDER — FENTANYL CITRATE (PF) 100 MCG/2ML IJ SOLN
INTRAMUSCULAR | Status: AC
  Filled 2018-06-14: qty 2

## 2018-06-14 MED ORDER — BUPIVACAINE-EPINEPHRINE 0.25% -1:200000 IJ SOLN
INTRAMUSCULAR | Status: DC | PRN
  Administered 2018-06-14: 30 mL

## 2018-06-14 MED ORDER — LIDOCAINE HCL (PF) 2 % IJ SOLN
INTRAMUSCULAR | Status: AC
  Filled 2018-06-14: qty 10

## 2018-06-14 MED ORDER — ONDANSETRON HCL 4 MG/2ML IJ SOLN
INTRAMUSCULAR | Status: DC | PRN
  Administered 2018-06-14: 4 mg via INTRAVENOUS

## 2018-06-14 MED ORDER — PHENYLEPHRINE HCL 10 MG/ML IJ SOLN
INTRAMUSCULAR | Status: DC | PRN
  Administered 2018-06-14 (×2): 200 ug via INTRAVENOUS

## 2018-06-14 MED ORDER — PROPOFOL 10 MG/ML IV BOLUS
INTRAVENOUS | Status: AC
  Filled 2018-06-14: qty 20

## 2018-06-14 MED ORDER — KETAMINE HCL 50 MG/ML IJ SOLN
INTRAMUSCULAR | Status: AC
  Filled 2018-06-14: qty 10

## 2018-06-14 MED ORDER — PROPOFOL 500 MG/50ML IV EMUL
INTRAVENOUS | Status: DC | PRN
  Administered 2018-06-14: 15 ug/kg/min via INTRAVENOUS

## 2018-06-14 MED ORDER — ONDANSETRON HCL 4 MG/2ML IJ SOLN
INTRAMUSCULAR | Status: AC
  Filled 2018-06-14: qty 2

## 2018-06-14 MED ORDER — BISACODYL 10 MG RE SUPP
10.0000 mg | Freq: Every day | RECTAL | Status: DC | PRN
  Administered 2018-06-16: 10 mg via RECTAL
  Filled 2018-06-14 (×2): qty 1

## 2018-06-14 MED ORDER — SODIUM CHLORIDE 0.9 % IV SOLN
INTRAVENOUS | Status: DC
  Administered 2018-06-14: 16:00:00 via INTRAVENOUS

## 2018-06-14 MED ORDER — MAGNESIUM HYDROXIDE 400 MG/5ML PO SUSP
30.0000 mL | Freq: Every day | ORAL | Status: DC | PRN
  Administered 2018-06-17: 30 mL via ORAL
  Filled 2018-06-14: qty 30

## 2018-06-14 MED ORDER — MENTHOL 3 MG MT LOZG
1.0000 | LOZENGE | OROMUCOSAL | Status: DC | PRN
  Filled 2018-06-14: qty 9

## 2018-06-14 MED ORDER — BUPIVACAINE HCL (PF) 0.5 % IJ SOLN
INTRAMUSCULAR | Status: DC | PRN
  Administered 2018-06-14: 2.5 mL

## 2018-06-14 MED ORDER — DEXAMETHASONE SODIUM PHOSPHATE 10 MG/ML IJ SOLN
INTRAMUSCULAR | Status: AC
  Filled 2018-06-14: qty 1

## 2018-06-14 SURGICAL SUPPLY — 57 items
BLADE SAGITTAL AGGR TOOTH XLG (BLADE) ×3 IMPLANT
BNDG COHESIVE 6X5 TAN STRL LF (GAUZE/BANDAGES/DRESSINGS) ×9 IMPLANT
CANISTER SUCT 1200ML W/VALVE (MISCELLANEOUS) ×3 IMPLANT
CHLORAPREP W/TINT 26ML (MISCELLANEOUS) ×3 IMPLANT
COVER WAND RF STERILE (DRAPES) ×3 IMPLANT
DRAPE C-ARM XRAY 36X54 (DRAPES) ×3 IMPLANT
DRAPE INCISE IOBAN 66X60 STRL (DRAPES) IMPLANT
DRAPE POUCH INSTRU U-SHP 10X18 (DRAPES) ×3 IMPLANT
DRAPE SHEET LG 3/4 BI-LAMINATE (DRAPES) ×9 IMPLANT
DRAPE TABLE BACK 80X90 (DRAPES) ×3 IMPLANT
DRESSING SURGICEL FIBRLLR 1X2 (HEMOSTASIS) ×2 IMPLANT
DRSG OPSITE POSTOP 4X8 (GAUZE/BANDAGES/DRESSINGS) ×3 IMPLANT
DRSG SURGICEL FIBRILLAR 1X2 (HEMOSTASIS) ×6
ELECT BLADE 6.5 EXT (BLADE) ×3 IMPLANT
ELECT REM PT RETURN 9FT ADLT (ELECTROSURGICAL) ×3
ELECTRODE REM PT RTRN 9FT ADLT (ELECTROSURGICAL) ×1 IMPLANT
GLOVE BIOGEL PI IND STRL 9 (GLOVE) ×1 IMPLANT
GLOVE BIOGEL PI INDICATOR 9 (GLOVE) ×2
GLOVE SURG SYN 9.0  PF PI (GLOVE) ×4
GLOVE SURG SYN 9.0 PF PI (GLOVE) ×2 IMPLANT
GOWN SRG 2XL LVL 4 RGLN SLV (GOWNS) ×1 IMPLANT
GOWN STRL NON-REIN 2XL LVL4 (GOWNS) ×2
GOWN STRL REUS W/ TWL LRG LVL3 (GOWN DISPOSABLE) ×1 IMPLANT
GOWN STRL REUS W/TWL LRG LVL3 (GOWN DISPOSABLE) ×2
HEAD BIPOLAR HIP SZ47 28 (Head) ×3 IMPLANT
HEMOVAC 400CC 10FR (MISCELLANEOUS) IMPLANT
HIP FEM HD S 28 (Head) ×3 IMPLANT
HOLDER FOLEY CATH W/STRAP (MISCELLANEOUS) ×3 IMPLANT
HOOD PEEL AWAY FLYTE STAYCOOL (MISCELLANEOUS) ×3 IMPLANT
KIT PREVENA INCISION MGT 13 (CANNISTER) ×3 IMPLANT
MAT ABSORB  FLUID 56X50 GRAY (MISCELLANEOUS) ×2
MAT ABSORB FLUID 56X50 GRAY (MISCELLANEOUS) ×1 IMPLANT
NDL SAFETY ECLIPSE 18X1.5 (NEEDLE) ×1 IMPLANT
NEEDLE HYPO 18GX1.5 SHARP (NEEDLE) ×2
NEEDLE SPNL 18GX3.5 QUINCKE PK (NEEDLE) ×3 IMPLANT
NS IRRIG 1000ML POUR BTL (IV SOLUTION) ×3 IMPLANT
PACK HIP COMPR (MISCELLANEOUS) ×3 IMPLANT
SCALPEL PROTECTED #10 DISP (BLADE) ×6 IMPLANT
SOL PREP PVP 2OZ (MISCELLANEOUS) ×3
SOLUTION PREP PVP 2OZ (MISCELLANEOUS) ×1 IMPLANT
SPONGE DRAIN TRACH 4X4 STRL 2S (GAUZE/BANDAGES/DRESSINGS) ×3 IMPLANT
STAPLER SKIN PROX 35W (STAPLE) ×3 IMPLANT
STEM FEMORAL 4 STD COLLARED (Stem) ×3 IMPLANT
STRAP SAFETY 5IN WIDE (MISCELLANEOUS) ×3 IMPLANT
SUT DVC 2 QUILL PDO  T11 36X36 (SUTURE) ×2
SUT DVC 2 QUILL PDO T11 36X36 (SUTURE) ×1 IMPLANT
SUT SILK 0 (SUTURE) ×2
SUT SILK 0 30XBRD TIE 6 (SUTURE) ×1 IMPLANT
SUT V-LOC 90 ABS DVC 3-0 CL (SUTURE) ×3 IMPLANT
SUT VIC AB 1 CT1 36 (SUTURE) ×3 IMPLANT
SYR 20CC LL (SYRINGE) ×3 IMPLANT
SYR 30ML LL (SYRINGE) ×3 IMPLANT
SYR BULB IRRIG 60ML STRL (SYRINGE) ×3 IMPLANT
TAPE MICROFOAM 4IN (TAPE) ×3 IMPLANT
TOWEL OR 17X26 4PK STRL BLUE (TOWEL DISPOSABLE) ×3 IMPLANT
TRAY FOLEY MTR SLVR 16FR STAT (SET/KITS/TRAYS/PACK) ×3 IMPLANT
WND VAC CANISTER 500ML (MISCELLANEOUS) ×3 IMPLANT

## 2018-06-14 NOTE — Anesthesia Procedure Notes (Signed)
Date/Time: 06/14/2018 12:41 PM Performed by: Ginger Carne, CRNA Pre-anesthesia Checklist: Patient identified, Emergency Drugs available, Suction available, Patient being monitored and Timeout performed Patient Re-evaluated:Patient Re-evaluated prior to induction Oxygen Delivery Method: Simple face mask Preoxygenation: Pre-oxygenation with 100% oxygen

## 2018-06-14 NOTE — Op Note (Signed)
06/14/2018  2:09 PM  PATIENT:  Jill Henderson  84 y.o. female  PRE-OPERATIVE DIAGNOSIS:  LEFT HIP FRACTURE  POST-OPERATIVE DIAGNOSIS: Displaced left femoral neck fracture  PROCEDURE:  Procedure(s): ARTHROPLASTY BIPOLAR HIP (HEMIARTHROPLASTY) (Left)  SURGEON: Leitha Schuller, MD  ASSISTANTS: None  ANESTHESIA:   spinal  EBL:  Total I/O In: -  Out: 100 [Urine:100]  BLOOD ADMINISTERED:none  DRAINS: none   LOCAL MEDICATIONS USED:  MARCAINE     SPECIMEN:  Source of Specimen:  Left femoral head  DISPOSITION OF SPECIMEN:  PATHOLOGY  COUNTS:  YES  TOURNIQUET:  * No tourniquets in log *  IMPLANTS: Medacta AMIS 4 standard stem with S metal 28 mm head and 47 mm bipolar component  DICTATION: .Dragon Dictation   The patient was brought to the operating room and after spinal anesthesia was obtained patient was placed on the operative table with the ipsilateral foot into the Medacta attachment, contralateral leg on a well-padded table. C-arm was brought in and preop template x-ray taken. After prepping and draping in usual sterile fashion appropriate patient identification and timeout procedures were completed. Anterior approach to the hip was obtained and centered over the greater trochanter and TFL muscle. The subcutaneous tissue was incised hemostasis being achieved by electrocautery. TFL fascia was incised and the muscle retracted laterally deep retractor placed. The lateral femoral circumflex vessels were identified and ligated. The anterior capsule was exposed and a capsulotomy performed.  The fracture site was identified and a femoral neck cut made distal to this and the neck removed followed by removal of the head without difficulty with minimal degenerative changes present.  The head trial was placed and a 47 mm head fit well.. The leg was then externally rotated and ischiofemoral and pubofemoral releases carried out. The femur was sequentially broached to a size 4, sizing checked with  the C arm and the final components chosen. The 4 standard stem was inserted along with a metal S 28 mm head and a 47 mm bipolar head . The hip was reduced and was stable the wound was thoroughly irrigated with fibrillar placed along the posterior capsule and medial neck. The deep fascia ws closed using a heavy Quill after infiltration of 30 cc of quarter percent Sensorcaine with epinephrine.3-0 V-loc to close the skin with skin staples. Xeroform and honeycomb dressing applied.  Patient was sent to recovery in stable condition.   PLAN OF CARE: Continue as inpatient

## 2018-06-14 NOTE — Anesthesia Procedure Notes (Signed)
Spinal  Patient location during procedure: OR Start time: 06/14/2018 12:48 PM End time: 06/14/2018 1:00 PM Staffing Anesthesiologist: Alvin Critchley, MD Performed: anesthesiologist  Preanesthetic Checklist Completed: patient identified, site marked, surgical consent, pre-op evaluation, timeout performed, IV checked, risks and benefits discussed and monitors and equipment checked Spinal Block Patient position: sitting Prep: ChloraPrep Patient monitoring: heart rate, continuous pulse ox, blood pressure and cardiac monitor Approach: right paramedian Location: L3-4 Injection technique: single-shot Needle Needle type: Whitacre and Introducer  Needle gauge: 24 G Needle length: 9 cm Assessment Sensory level: T10 Additional Notes Negative paresthesia. Negative blood return. Positive free-flowing CSF. Expiration date of kit checked and confirmed. Patient tolerated procedure well, without complications.

## 2018-06-14 NOTE — Progress Notes (Signed)
On pacu arrival, verified heart rate with CRNA Jen, Heart rate ran in the upper 40's.  46-54 at present time.  Dr. Noralyn Pick aware.

## 2018-06-14 NOTE — Anesthesia Preprocedure Evaluation (Signed)
Anesthesia Evaluation  Patient identified by MRN, date of birth, ID band Patient awake    Reviewed: Allergy & Precautions, NPO status , Patient's Chart, lab work & pertinent test results, reviewed documented beta blocker date and time   Airway Mallampati: II       Dental   Pulmonary neg pulmonary ROS,    Pulmonary exam normal        Cardiovascular hypertension, Pt. on medications and Pt. on home beta blockers Normal cardiovascular exam     Neuro/Psych negative neurological ROS  negative psych ROS   GI/Hepatic negative GI ROS, Neg liver ROS,   Endo/Other  negative endocrine ROS  Renal/GU negative Renal ROS  negative genitourinary   Musculoskeletal   Abdominal Normal abdominal exam  (+)   Peds  Hematology negative hematology ROS (+)   Anesthesia Other Findings Past Medical History: No date: Hypertension No date: Knee pain  Reproductive/Obstetrics                             Anesthesia Physical Anesthesia Plan  ASA: III and emergent  Anesthesia Plan: Spinal   Post-op Pain Management:    Induction:   PONV Risk Score and Plan:   Airway Management Planned: Nasal Cannula  Additional Equipment:   Intra-op Plan:   Post-operative Plan:   Informed Consent: I have reviewed the patients History and Physical, chart, labs and discussed the procedure including the risks, benefits and alternatives for the proposed anesthesia with the patient or authorized representative who has indicated his/her understanding and acceptance.   Dental advisory given  Plan Discussed with: CRNA and Surgeon  Anesthesia Plan Comments:         Anesthesia Quick Evaluation

## 2018-06-14 NOTE — Progress Notes (Signed)
15 minute call to floor. 

## 2018-06-14 NOTE — Anesthesia Post-op Follow-up Note (Signed)
Anesthesia QCDR form completed.        

## 2018-06-14 NOTE — Transfer of Care (Signed)
Immediate Anesthesia Transfer of Care Note  Patient: Jill Henderson  Procedure(s) Performed: ARTHROPLASTY BIPOLAR HIP (HEMIARTHROPLASTY) (Left )  Patient Location: PACU  Anesthesia Type:Spinal  Level of Consciousness: sedated  Airway & Oxygen Therapy: Patient Spontanous Breathing and Patient connected to face mask oxygen  Post-op Assessment: Report given to RN and Post -op Vital signs reviewed and stable  Post vital signs: Reviewed and stable  Last Vitals:  Vitals Value Taken Time  BP 94/33 06/14/2018  2:13 PM  Temp    Pulse 49 06/14/2018  2:14 PM  Resp 17 06/14/2018  2:14 PM  SpO2 100 % 06/14/2018  2:14 PM  Vitals shown include unvalidated device data.  Last Pain:  Vitals:   06/14/18 0727  TempSrc: Oral  PainSc:       Patients Stated Pain Goal: 0 (06/13/18 2325)  Complications: No apparent anesthesia complications

## 2018-06-15 LAB — URINALYSIS, COMPLETE (UACMP) WITH MICROSCOPIC
BILIRUBIN URINE: NEGATIVE
Glucose, UA: NEGATIVE mg/dL
Ketones, ur: 5 mg/dL — AB
Nitrite: NEGATIVE
Protein, ur: 100 mg/dL — AB
SPECIFIC GRAVITY, URINE: 1.026 (ref 1.005–1.030)
WBC, UA: >50 WBC/hpf — ABNORMAL HIGH (ref 0–5)
pH: 5 (ref 5.0–8.0)

## 2018-06-15 LAB — BASIC METABOLIC PANEL
ANION GAP: 5 (ref 5–15)
BUN: 26 mg/dL — ABNORMAL HIGH (ref 8–23)
CALCIUM: 8 mg/dL — AB (ref 8.9–10.3)
CO2: 22 mmol/L (ref 22–32)
Chloride: 109 mmol/L (ref 98–111)
Creatinine, Ser: 0.96 mg/dL (ref 0.44–1.00)
GFR, EST AFRICAN AMERICAN: 55 mL/min — AB (ref >60)
GFR, EST NON AFRICAN AMERICAN: 47 mL/min — AB (ref >60)
GLUCOSE: 105 mg/dL — AB (ref 70–99)
Potassium: 3.8 mmol/L (ref 3.5–5.1)
SODIUM: 136 mmol/L (ref 135–145)

## 2018-06-15 MED ORDER — SODIUM CHLORIDE 0.9 % IV SOLN
1.0000 g | INTRAVENOUS | Status: DC
  Administered 2018-06-15 – 2018-06-16 (×2): 1 g via INTRAVENOUS
  Filled 2018-06-15: qty 1
  Filled 2018-06-15: qty 10
  Filled 2018-06-15: qty 1
  Filled 2018-06-15: qty 10

## 2018-06-15 NOTE — Evaluation (Signed)
Physical Therapy Evaluation Patient Details Name: Jill Henderson MRN: [id number] DOB: 1916/09/12 Today's Date: 06/15/2018   History of Present Illness  Pt is a 84 y.o. female with a h/o HTN who presented after a fall.  She stated that she got up earlier and got out of bed and her knee was hurting and she was trying to use her walker and the walker hit the edge of the bed and she fell to the ground.  In the ER she was found to have a hip fracture.  Pt is now s/p L hip hemiarthroplasty.  Assessment also includes HTN and dehydration.       Clinical Impression  Pt presents with deficits in strength, transfers, mobility, gait, balance, and activity tolerance.  Pt near total assist with all bed mobility tasks and sit to/from stand transfers presenting with posterior lean and inability to come to full stand.  Pt was mildly agitated and confused and required max encouragement for participation during the session.  Per granddaughter at bedside pt at baseline is oriented, conversational, and presents with good long and short term memory.  The pt would not be safe to return to her prior living situation at this time and will benefit from PT services in a SNF setting upon discharge to safely address above deficits for decreased caregiver assistance and eventual return to PLOF.      Follow Up Recommendations SNF    Equipment Recommendations  None recommended by PT    Recommendations for Other Services       Precautions / Restrictions Precautions Precautions: Fall;Anterior Hip Precaution Comments: No hip precautions per orders Restrictions Weight Bearing Restrictions: Yes LLE Weight Bearing: Weight bearing as tolerated      Mobility  Bed Mobility Overal bed mobility: Needs Assistance Bed Mobility: Supine to Sit;Sit to Supine     Supine to sit: +2 for physical assistance;Max assist Sit to supine: +2 for physical assistance;Max assist   General bed mobility comments: Pt near total assist for  bed mobility tasks  Transfers Overall transfer level: Needs assistance Equipment used: Rolling walker (2 wheeled) Transfers: Sit to/from Stand Sit to Stand: +2 physical assistance;Max assist         General transfer comment: Pt near total assist with sit to/from stand transfers presenting with posterior lean and inability to come to full stand.   Ambulation/Gait             General Gait Details: Unable/unsafe to attempt  Stairs            Wheelchair Mobility    Modified Rankin (Stroke Patients Only)       Balance Overall balance assessment: Needs assistance Sitting-balance support: Bilateral upper extremity supported Sitting balance-Leahy Scale: Fair         Standing balance comment: NT, pt unable to come to full standing position                             Pertinent Vitals/Pain Pain Assessment: Faces Faces Pain Scale: Hurts whole lot Pain Location: L hip Pain Descriptors / Indicators: Aching;Guarding;Grimacing Pain Intervention(s): Limited activity within patient's tolerance;Monitored during session;Premedicated before session    Home Living Family/patient expects to be discharged to:: Private residence(History provided by granddaugther Joy at bedside) Living Arrangements: Alone Available Help at Discharge: Family;Personal care attendant;Available PRN/intermittently Type of Home: House Home Access: Stairs to enter Entrance Stairs-Rails: Left Entrance Stairs-Number of Steps: 2 Home Layout: One level Home Equipment:  Walker - 2 wheels;Wheelchair - manual      Prior Function Level of Independence: Needs assistance   Gait / Transfers Assistance Needed: Mod Ind with amb with a RW with no other fall history other than current fall when pt bumped into bed with her RW and fell  ADL's / Homemaking Assistance Needed: PCA comes 3x/wk for around one hour each visit and assists with bathing, meals, and dressing        Hand Dominance         Extremity/Trunk Assessment   Upper Extremity Assessment Upper Extremity Assessment: Generalized weakness;Difficult to assess due to impaired cognition    Lower Extremity Assessment Lower Extremity Assessment: Generalized weakness;Difficult to assess due to impaired cognition       Communication      Cognition Arousal/Alertness: Lethargic Behavior During Therapy: Agitated Overall Cognitive Status: Impaired/Different from baseline Area of Impairment: Orientation;Following commands;Awareness                 Orientation Level: Disoriented to;Place;Time;Situation     Following Commands: Follows one step commands inconsistently       General Comments: Per granddaughter at baseline pt demonstrates good awerness, orientation, and STM recall.       General Comments      Exercises     Assessment/Plan    PT Assessment Patient needs continued PT services  PT Problem List Decreased strength;Decreased activity tolerance;Decreased balance;Decreased mobility;Decreased cognition       PT Treatment Interventions DME instruction;Gait training;Stair training;Functional mobility training;Balance training;Therapeutic exercise;Therapeutic activities;Patient/family education    PT Goals (Current goals can be found in the Care Plan section)  Acute Rehab PT Goals PT Goal Formulation: Patient unable to participate in goal setting Time For Goal Achievement: 06/28/18 Potential to Achieve Goals: Fair    Frequency BID   Barriers to discharge Inaccessible home environment;Decreased caregiver support      Co-evaluation               AM-PAC PT "6 Clicks" Daily Activity  Outcome Measure Difficulty turning over in bed (including adjusting bedclothes, sheets and blankets)?: Unable Difficulty moving from lying on back to sitting on the side of the bed? : Unable Difficulty sitting down on and standing up from a chair with arms (e.g., wheelchair, bedside commode, etc,.)?:  Unable Help needed moving to and from a bed to chair (including a wheelchair)?: Total Help needed walking in hospital room?: Total Help needed climbing 3-5 steps with a railing? : Total 6 Click Score: 6    End of Session Equipment Utilized During Treatment: Gait belt;Oxygen Activity Tolerance: Patient limited by pain;Treatment limited secondary to agitation Patient left: in bed;with bed alarm set;with call bell/phone within reach;with family/visitor present Nurse Communication: Mobility status PT Visit Diagnosis: Muscle weakness (generalized) (M62.81);Other abnormalities of gait and mobility (R26.89)    Time: 1610-9604 PT Time Calculation (min) (ACUTE ONLY): 29 min   Charges:   PT Evaluation $PT Eval Low Complexity: 1 Low          D. Scott Ethlyn Alto PT, DPT 06/15/18, 11:43 AM

## 2018-06-15 NOTE — Progress Notes (Signed)
  Subjective: 1 Day Post-Op Procedure(s) (LRB): ARTHROPLASTY BIPOLAR HIP (HEMIARTHROPLASTY) (Left) Patient is unable to give me a pain score. Patient is demented with safety mittens in place. PT and Care management to assist with discharge planning. Negative for chest pain and shortness of breath Fever: yes, 100 last night. Gastrointestinal:Negative for nausea and vomiting  Objective: Vital signs in last 24 hours: Temp:  [97.4 F (36.3 C)-100.1 F (37.8 C)] 100.1 F (37.8 C) (11/02 2358) Pulse Rate:  [44-60] 60 (11/02 2358) Resp:  [14-19] 19 (11/02 2358) BP: (91-145)/(33-81) 97/80 (11/02 2358) SpO2:  [85 %-99 %] 97 % (11/02 2358)  Intake/Output from previous day:  Intake/Output Summary (Last 24 hours) at 06/15/2018 0839 Last data filed at 06/15/2018 0500 Gross per 24 hour  Intake 1536.59 ml  Output 400 ml  Net 1136.59 ml    Intake/Output this shift: No intake/output data recorded.  Labs: Recent Labs    06/13/18 1254 06/14/18 0438  HGB 12.1 11.8*   Recent Labs    06/13/18 1254 06/14/18 0438  WBC 12.4* 12.3*  RBC 3.85* 3.70*  HCT 37.2 35.4*  PLT 225 196   Recent Labs    06/14/18 0438 06/15/18 0446  NA 137 136  K 3.3* 3.8  CL 105 109  CO2 21* 22  BUN 24* 26*  CREATININE 0.95 0.96  GLUCOSE 117* 105*  CALCIUM 8.9 8.0*   Recent Labs    06/13/18 1254  INR 1.00     EXAM General - Patient is Disorganized, Confused and Lacking Extremity - ABD soft Intact pulses distally Dorsiflexion/Plantar flexion intact Incision: dressing C/D/I No cellulitis present Dressing/Incision - clean, dry, no drainage Motor Function - intact, moving foot and toes well on exam.   Past Medical History:  Diagnosis Date  . Hypertension   . Knee pain     Assessment/Plan: 1 Day Post-Op Procedure(s) (LRB): ARTHROPLASTY BIPOLAR HIP (HEMIARTHROPLASTY) (Left) Active Problems:   Closed left hip fracture (HCC)  There is no height or weight on file to calculate BMI. Advance  diet Up with therapy D/C IV fluids when tolerating po intake.  Labs reviewed, WBC 12.3 and recent fevers.  Continue to monitor. Will obtain a UA at this time. Up with therapy today as tolerated. Begin working on BM.  DVT Prophylaxis - Aspirin, Foot Pumps and TED hose Weight-Bearing as tolerated to left leg  J. Horris Latino, PA-C [facility] Orthopaedic Surgery 06/15/2018, 8:39 AM

## 2018-06-15 NOTE — Anesthesia Postprocedure Evaluation (Signed)
Anesthesia Post Note  Patient: Samia R Emberton  Procedure(s) Performed: ARTHROPLASTY BIPOLAR HIP (HEMIARTHROPLASTY) (Left )  Patient location during evaluation: Other Anesthesia Type: Spinal Level of consciousness: awake and alert Pain management: pain level controlled Vital Signs Assessment: post-procedure vital signs reviewed and stable Respiratory status: spontaneous breathing Cardiovascular status: blood pressure returned to baseline Anesthetic complications: no     Last Vitals:  Vitals:   06/15/18 1602 06/15/18 2131  BP: 101/63   Pulse: 61   Resp:    Temp: (!) 36.3 C 37.1 C  SpO2: 94%     Last Pain:  Vitals:   06/15/18 2131  TempSrc: Oral  PainSc:                  Alic Hilburn

## 2018-06-15 NOTE — Plan of Care (Signed)
  Problem: Clinical Measurements: Goal: Ability to maintain clinical measurements within normal limits will improve Outcome: Progressing Goal: Will remain free from infection Outcome: Progressing Goal: Cardiovascular complication will be avoided Outcome: Progressing   Problem: Activity: Goal: Risk for activity intolerance will decrease Outcome: Progressing   Problem: Nutrition: Goal: Adequate nutrition will be maintained Outcome: Progressing   Problem: Coping: Goal: Level of anxiety will decrease Outcome: Progressing   Problem: Elimination: Goal: Will not experience complications related to bowel motility Outcome: Progressing   Problem: Pain Managment: Goal: General experience of comfort will improve Outcome: Progressing   Problem: Safety: Goal: Ability to remain free from injury will improve Outcome: Progressing

## 2018-06-16 ENCOUNTER — Encounter: Payer: Self-pay | Admitting: Orthopedic Surgery

## 2018-06-16 LAB — CBC
HCT: 28.1 % — ABNORMAL LOW (ref 36.0–46.0)
Hemoglobin: 9.1 g/dL — ABNORMAL LOW (ref 12.0–15.0)
MCH: 31.9 pg (ref 26.0–34.0)
MCHC: 32.4 g/dL (ref 30.0–36.0)
MCV: 98.6 fL (ref 80.0–100.0)
PLATELETS: 134 10*3/uL — AB (ref 150–400)
RBC: 2.85 MIL/uL — AB (ref 3.87–5.11)
RDW: 13.1 % (ref 11.5–15.5)
WBC: 10 10*3/uL (ref 4.0–10.5)
nRBC: 0 % (ref 0.0–0.2)

## 2018-06-16 MED ORDER — POLYETHYLENE GLYCOL 3350 17 G PO PACK
17.0000 g | PACK | Freq: Every day | ORAL | Status: DC
  Administered 2018-06-17: 17 g via ORAL
  Filled 2018-06-16 (×2): qty 1

## 2018-06-16 NOTE — Progress Notes (Signed)
Physical Therapy Treatment Patient Details Name: Jill Henderson MRN: [id number] DOB: 02/19/1917 Today's Date: 06/16/2018    History of Present Illness Pt is a 84 y.o. female with a h/o HTN who presented after a fall.  She stated that she got up earlier and got out of bed and her knee was hurting and she was trying to use her walker and the walker hit the edge of the bed and she fell to the ground.  In the ER she was found to have a hip fracture.  Pt is now s/p L hip hemiarthroplasty.  Assessment also includes HTN and dehydration.       PT Comments    Pt presents with deficits in strength, transfers, mobility, gait, balance, and activity tolerance.  Pt more alert this session asking many questions during the session and requiring frequent encouragement for max participation.  Pt tolerated below therex well but required frequent therapeutic rest breaks during the session.  Pt continues to require extensive assist with all bed mobility tasks secondary to L hip pain with movement.  Pt was able to stand several times from an elevated EOB with grossly improved performance as the session progressed but required extensive assistance throughout.  Pt was unable to advance either LE during the first two standing sessions but on the third was able to take one small step with the LLE.  Pt required constant assist to prevent posterior LOB while in standing.  Pt will benefit from PT services in a SNF setting upon discharge to safely address above deficits for decreased caregiver assistance and eventual return to PLOF.     Follow Up Recommendations  SNF     Equipment Recommendations  None recommended by PT    Recommendations for Other Services       Precautions / Restrictions Precautions Precautions: Fall;Anterior Hip Precaution Comments: No hip precautions per orders Restrictions Weight Bearing Restrictions: Yes LLE Weight Bearing: Weight bearing as tolerated    Mobility  Bed Mobility Overal bed  mobility: Needs Assistance Bed Mobility: Supine to Sit;Sit to Supine     Supine to sit: +2 for physical assistance;Max assist Sit to supine: +2 for physical assistance;Max assist   General bed mobility comments: Pt near total assist for bed mobility tasks  Transfers Overall transfer level: Needs assistance Equipment used: Rolling walker (2 wheeled) Transfers: Sit to/from Stand Sit to Stand: +2 physical assistance;Max assist         General transfer comment: Multiple sit to/from stands performed from an elevated EOB with max verbal and tactile cues for sequencing  Ambulation/Gait Ambulation/Gait assistance: +2 physical assistance;Max assist Gait Distance (Feet): 1 Feet Assistive device: Rolling walker (2 wheeled) Gait Pattern/deviations: Step-to pattern;Antalgic;Trunk flexed     General Gait Details: Max A to prevent posterior LOB in standing with pt highly antalgic on the LLE but able to take one small step with the LLE only.   Stairs             Wheelchair Mobility    Modified Rankin (Stroke Patients Only)       Balance Overall balance assessment: Needs assistance Sitting-balance support: Bilateral upper extremity supported Sitting balance-Leahy Scale: Fair     Standing balance support: Bilateral upper extremity supported Standing balance-Leahy Scale: Zero Standing balance comment: Constant assist required to prevent posterior LOB                            Cognition Arousal/Alertness: Awake/alert Behavior  During Therapy: WFL for tasks assessed/performed Overall Cognitive Status: Impaired/Different from baseline                                 General Comments: Cognition grossly improved this session with pt more alert and able to follow commands but still not back to baseline      Exercises Total Joint Exercises Ankle Circles/Pumps: AAROM;Both;10 reps Quad Sets: Strengthening;Both;10 reps Short Arc Quad: AROM;Both;10  reps Heel Slides: AAROM;AROM;Right;5 reps;10 reps Hip ABduction/ADduction: AROM;AAROM;Right;5 reps;10 reps Straight Leg Raises: AROM;AAROM;Right;5 reps;10 reps Long Arc Quad: AROM;Both;5 reps;10 reps Knee Flexion: AROM;Both;5 reps;10 reps Other Exercises Other Exercises: Anterior weight shifting activities in sitting to facilitate improved transfer performance    General Comments        Pertinent Vitals/Pain Pain Assessment: Faces Faces Pain Scale: Hurts even more Pain Location: L hip Pain Descriptors / Indicators: Aching;Guarding;Grimacing Pain Intervention(s): Premedicated before session;Monitored during session;Limited activity within patient's tolerance    Home Living                      Prior Function            PT Goals (current goals can now be found in the care plan section) Progress towards PT goals: Progressing toward goals    Frequency    BID      PT Plan Current plan remains appropriate    Co-evaluation              AM-PAC PT "6 Clicks" Daily Activity  Outcome Measure                   End of Session Equipment Utilized During Treatment: Gait belt Activity Tolerance: Patient limited by pain Patient left: in bed;with bed alarm set;with call bell/phone within reach;with SCD's reapplied;with family/visitor present Nurse Communication: Mobility status;Other (comment)(Pt remains limited by pain) PT Visit Diagnosis: Muscle weakness (generalized) (M62.81);Other abnormalities of gait and mobility (R26.89)     Time: 2956-2130 PT Time Calculation (min) (ACUTE ONLY): 41 min  Charges:  $Therapeutic Exercise: 23-37 mins $Therapeutic Activity: 8-22 mins                     D. Scott Arthella Headings PT, DPT 06/16/18, 11:41 AM

## 2018-06-16 NOTE — Progress Notes (Signed)
[facility] - [place] at [facility]   PATIENT NAME: Jill Henderson    MR#:  [id number]  DATE OF BIRTH:  1917-06-15  SUBJECTIVE:  CHIEF COMPLAINT:   Chief Complaint  Patient presents with  . Fall   Came after a fall, s/p surgery, no new complains.  REVIEW OF SYSTEMS:  CONSTITUTIONAL: No fever, fatigue or weakness.  EYES: No blurred or double vision.  EARS, NOSE, AND THROAT: No tinnitus or ear pain.  RESPIRATORY: No cough, shortness of breath, wheezing or hemoptysis.  CARDIOVASCULAR: No chest pain, orthopnea, edema.  GASTROINTESTINAL: No nausea, vomiting, diarrhea or abdominal pain.  GENITOURINARY: No dysuria, hematuria.  ENDOCRINE: No polyuria, nocturia,  HEMATOLOGY: No anemia, easy bruising or bleeding SKIN: No rash or lesion. MUSCULOSKELETAL: No joint pain or arthritis.  Left hip pain. NEUROLOGIC: No tingling, numbness, weakness.  PSYCHIATRY: No anxiety or depression.   ROS  DRUG ALLERGIES:  No Known Allergies  VITALS:  Blood pressure 117/70, pulse (!) 55, temperature 98.2 F (36.8 C), resp. rate (!) 24, weight 55.8 kg, SpO2 99 %.  PHYSICAL EXAMINATION:  GENERAL:  84 y.o.-year-old patient lying in the bed with no acute distress.  EYES: Pupils equal, round, reactive to light and accommodation. No scleral icterus. Extraocular muscles intact.  HEENT: Head atraumatic, normocephalic. Oropharynx and nasopharynx clear.  NECK:  Supple, no jugular venous distention. No thyroid enlargement, no tenderness.  LUNGS: Normal breath sounds bilaterally, no wheezing, rales,rhonchi or crepitation. No use of accessory muscles of respiration.  CARDIOVASCULAR: S1, S2 normal. No murmurs, rubs, or gallops.  ABDOMEN: Soft, nontender, nondistended. Bowel sounds present. No organomegaly or mass.  EXTREMITIES: No pedal edema, cyanosis, or clubbing.  NEUROLOGIC: Cranial nerves II through XII are intact. Muscle strength 3/5 in all extremities- LLL painful on movements . Sensation  intact. Gait not checked.  PSYCHIATRIC: The patient is alert and oriented x 1.  SKIN: No obvious rash, lesion, or ulcer.   Physical Exam LABORATORY PANEL:   CBC Recent Labs  Lab 06/16/18 0350  WBC 10.0  HGB 9.1*  HCT 28.1*  PLT 134*   ------------------------------------------------------------------------------------------------------------------  Chemistries  Recent Labs  Lab 06/15/18 0446  NA 136  K 3.8  CL 109  CO2 22  GLUCOSE 105*  BUN 26*  CREATININE 0.96  CALCIUM 8.0*   ------------------------------------------------------------------------------------------------------------------  Cardiac Enzymes No results for input(s): TROPONINI in the last 168 hours. ------------------------------------------------------------------------------------------------------------------  RADIOLOGY:  Dg Hip Operative Unilat W Or W/o Pelvis Left  Result Date: 06/14/2018 CLINICAL DATA:  Left hip fracture. EXAM: OPERATIVE LEFT HIP (WITH PELVIS IF PERFORMED) TECHNIQUE: Fluoroscopic spot image(s) were submitted for interpretation post-operatively. FLUOROSCOPY TIME:  6 seconds. COMPARISON:  Left hip x-rays from yesterday. FINDINGS: Intraoperative fluoroscopic images demonstrate interval left hip hemiarthroplasty. No acute abnormality. IMPRESSION: Intraoperative fluoroscopic guidance for left hip hemiarthroplasty. Electronically Signed   By: Obie Dredge M.D.   On: 06/14/2018 14:50   Dg Hip Unilat W Or W/o Pelvis 2-3 Views Left  Result Date: 06/14/2018 CLINICAL DATA:  Status post left hip prosthesis EXAM: DG HIP (WITH OR WITHOUT PELVIS) 2-3V LEFT COMPARISON:  06/13/2018 FINDINGS: Left hip prosthesis is now seen in satisfactory position. No acute fracture is noted. IMPRESSION: Status post left hip replacement Electronically Signed   By: Alcide Clever M.D.   On: 06/14/2018 15:23    ASSESSMENT AND PLAN:   Active Problems:   Closed left hip fracture (HCC)  1.   left hip fracture closed     No contraindications to  surgery at this time.  Patient not having any chest pain or shortness of breath. Manage per ortho/ 2.  Hypertension.  Blood pressure on the lower side.  Hold Norvasc and cut back the dose of atenolol. Stable now. 3.  Dehydration give very gentle IV fluids. 4.  Impaired fasting glucose checked a hemoglobin A1c- 5.2 5. Fall- have UTI, also get PT eval. 6. UTI- rocephin and follow cx result.   All the records are reviewed and case discussed with Care Management/Social Workerr. Management plans discussed with the patient, family and they are in agreement.  CODE STATUS: Full.  TOTAL TIME TAKING CARE OF THIS PATIENT: 35 minutes.   Discussed with Grand daughter in room.  POSSIBLE D/C IN 2-3 DAYS, DEPENDING ON CLINICAL CONDITION.   Altamese Dilling M.D on 06/16/2018   Between 7am to 6pm - Pager - [phone]  After 6pm go to [email or web address] - password [email or web address]  [facility] [place] [facility]  Office  [phone]  CC: Primary care physician; Oswaldo Conroy, MD  Note: This dictation was prepared with Dragon dictation along with smaller phrase technology. Any transcriptional errors that result from this process are unintentional.

## 2018-06-16 NOTE — Progress Notes (Signed)
[facility] - [place] at [facility]   PATIENT NAME: Jill Henderson    MR#:  [id number]  DATE OF BIRTH:  1917/07/18  SUBJECTIVE:  CHIEF COMPLAINT:   Chief Complaint  Patient presents with  . Fall   Came after a fall, for surgery today.  REVIEW OF SYSTEMS:  CONSTITUTIONAL: No fever, fatigue or weakness.  EYES: No blurred or double vision.  EARS, NOSE, AND THROAT: No tinnitus or ear pain.  RESPIRATORY: No cough, shortness of breath, wheezing or hemoptysis.  CARDIOVASCULAR: No chest pain, orthopnea, edema.  GASTROINTESTINAL: No nausea, vomiting, diarrhea or abdominal pain.  GENITOURINARY: No dysuria, hematuria.  ENDOCRINE: No polyuria, nocturia,  HEMATOLOGY: No anemia, easy bruising or bleeding SKIN: No rash or lesion. MUSCULOSKELETAL: No joint pain or arthritis.  Left hip pain. NEUROLOGIC: No tingling, numbness, weakness.  PSYCHIATRY: No anxiety or depression.   ROS  DRUG ALLERGIES:  No Known Allergies  VITALS:  Blood pressure 117/70, pulse (!) 55, temperature 98.2 F (36.8 C), resp. rate (!) 24, weight 55.8 kg, SpO2 99 %.  PHYSICAL EXAMINATION:  GENERAL:  84 y.o.-year-old patient lying in the bed with no acute distress.  EYES: Pupils equal, round, reactive to light and accommodation. No scleral icterus. Extraocular muscles intact.  HEENT: Head atraumatic, normocephalic. Oropharynx and nasopharynx clear.  NECK:  Supple, no jugular venous distention. No thyroid enlargement, no tenderness.  LUNGS: Normal breath sounds bilaterally, no wheezing, rales,rhonchi or crepitation. No use of accessory muscles of respiration.  CARDIOVASCULAR: S1, S2 normal. No murmurs, rubs, or gallops.  ABDOMEN: Soft, nontender, nondistended. Bowel sounds present. No organomegaly or mass.  EXTREMITIES: No pedal edema, cyanosis, or clubbing.  NEUROLOGIC: Cranial nerves II through XII are intact. Muscle strength 3/5 in all extremities- LLL painful on movements . Sensation intact. Gait not  checked.  PSYCHIATRIC: The patient is alert and oriented x 1.  SKIN: No obvious rash, lesion, or ulcer.   Physical Exam LABORATORY PANEL:   CBC Recent Labs  Lab 06/16/18 0350  WBC 10.0  HGB 9.1*  HCT 28.1*  PLT 134*   ------------------------------------------------------------------------------------------------------------------  Chemistries  Recent Labs  Lab 06/15/18 0446  NA 136  K 3.8  CL 109  CO2 22  GLUCOSE 105*  BUN 26*  CREATININE 0.96  CALCIUM 8.0*   ------------------------------------------------------------------------------------------------------------------  Cardiac Enzymes No results for input(s): TROPONINI in the last 168 hours. ------------------------------------------------------------------------------------------------------------------  RADIOLOGY:  Dg Hip Operative Unilat W Or W/o Pelvis Left  Result Date: 06/14/2018 CLINICAL DATA:  Left hip fracture. EXAM: OPERATIVE LEFT HIP (WITH PELVIS IF PERFORMED) TECHNIQUE: Fluoroscopic spot image(s) were submitted for interpretation post-operatively. FLUOROSCOPY TIME:  6 seconds. COMPARISON:  Left hip x-rays from yesterday. FINDINGS: Intraoperative fluoroscopic images demonstrate interval left hip hemiarthroplasty. No acute abnormality. IMPRESSION: Intraoperative fluoroscopic guidance for left hip hemiarthroplasty. Electronically Signed   By: Obie Dredge M.D.   On: 06/14/2018 14:50   Dg Hip Unilat W Or W/o Pelvis 2-3 Views Left  Result Date: 06/14/2018 CLINICAL DATA:  Status post left hip prosthesis EXAM: DG HIP (WITH OR WITHOUT PELVIS) 2-3V LEFT COMPARISON:  06/13/2018 FINDINGS: Left hip prosthesis is now seen in satisfactory position. No acute fracture is noted. IMPRESSION: Status post left hip replacement Electronically Signed   By: Alcide Clever M.D.   On: 06/14/2018 15:23    ASSESSMENT AND PLAN:   Active Problems:   Closed left hip fracture (HCC)  1.   left hip fracture closed    No  contraindications to surgery at  this time.  Patient not having any chest pain or shortness of breath. Manage per ortho/ 2.  Hypertension.  Blood pressure on the lower side.  Hold Norvasc and cut back the dose of atenolol. Stable now. 3.  Dehydration give very gentle IV fluids. 4.  Impaired fasting glucose checked a hemoglobin A1c- 5.2 5. Fall- will check UA.    All the records are reviewed and case discussed with Care Management/Social Workerr. Management plans discussed with the patient, family and they are in agreement.  CODE STATUS: Full.  TOTAL TIME TAKING CARE OF THIS PATIENT: 35 minutes.     POSSIBLE D/C IN 2-3 DAYS, DEPENDING ON CLINICAL CONDITION.   Altamese Dilling M.D on 06/16/2018   Between 7am to 6pm - Pager - [phone]  After 6pm go to [email or web address] - password [email or web address]  [facility] [place] [facility]  Office  [phone]  CC: Primary care physician; Oswaldo Conroy, MD  Note: This dictation was prepared with Dragon dictation along with smaller phrase technology. Any transcriptional errors that result from this process are unintentional.

## 2018-06-16 NOTE — Progress Notes (Signed)
Physical Therapy Treatment Patient Details Name: Jill Henderson MRN: [id number] DOB: 11-14-1916 Today's Date: 06/16/2018    History of Present Illness Pt is a 84 y.o. female with a h/o HTN who presented after a fall.  She stated that she got up earlier and got out of bed and her knee was hurting and she was trying to use her walker and the walker hit the edge of the bed and she fell to the ground.  In the ER she was found to have a hip fracture.  Pt is now s/p L hip hemiarthroplasty.  Assessment also includes HTN and dehydration.       PT Comments    Pt presents with deficits in strength, transfers, mobility, gait, balance, and activity tolerance.  Pt continues to be limited by LLE pain with functional mobility tasks but participates throughout session.  Pt required +2 max assist with bed mobility tasks and presented with mild posterior lean in sitting at the EOB the improved with anterior weight shifting activities.  Pt stood at the EOB multiple times with extensive +2 assist but was unable to advance either LE this session and presented with increased posterior instability.  Pt will benefit from PT services in a SNF setting upon discharge to safely address above deficits for decreased caregiver assistance and eventual return to PLOF.     Follow Up Recommendations  SNF     Equipment Recommendations  None recommended by PT    Recommendations for Other Services       Precautions / Restrictions Precautions Precautions: Fall;Anterior Hip Precaution Comments: No hip precautions per orders Restrictions Weight Bearing Restrictions: Yes LLE Weight Bearing: Weight bearing as tolerated    Mobility  Bed Mobility Overal bed mobility: Needs Assistance Bed Mobility: Supine to Sit;Sit to Supine     Supine to sit: +2 for physical assistance;Max assist Sit to supine: +2 for physical assistance;Max assist   General bed mobility comments: Pt near total assist for bed mobility  tasks  Transfers Overall transfer level: Needs assistance Equipment used: Rolling walker (2 wheeled) Transfers: Sit to/from Stand Sit to Stand: +2 physical assistance;Max assist         General transfer comment: Multiple sit to/from stands performed from an elevated EOB with max verbal and tactile cues for sequencing  Ambulation/Gait Ambulation/Gait assistance: +2 physical assistance;Max assist Gait Distance (Feet): 1 Feet Assistive device: Rolling walker (2 wheeled) Gait Pattern/deviations: Step-to pattern;Antalgic;Trunk flexed Gait velocity: Decreased   General Gait Details: Pt with increased posterior lean this session and unable to advance either LE   Stairs             Wheelchair Mobility    Modified Rankin (Stroke Patients Only)       Balance Overall balance assessment: Needs assistance Sitting-balance support: Bilateral upper extremity supported Sitting balance-Leahy Scale: Fair     Standing balance support: Bilateral upper extremity supported Standing balance-Leahy Scale: Zero Standing balance comment: Constant assist required to prevent posterior LOB                            Cognition Arousal/Alertness: Awake/alert Behavior During Therapy: WFL for tasks assessed/performed Overall Cognitive Status: History of cognitive impairments - at baseline                                 General Comments: Cognition grossly improved this session with pt more  alert and able to follow commands but still not back to baseline      Exercises Total Joint Exercises Ankle Circles/Pumps: AAROM;Both;10 reps Quad Sets: Strengthening;Both;10 reps Short Arc Quad: AROM;Both;10 reps Heel Slides: AAROM;AROM;Right;5 reps;10 reps Hip ABduction/ADduction: AROM;AAROM;Right;5 reps;10 reps Straight Leg Raises: AROM;AAROM;Right;5 reps;10 reps Long Arc Quad: AROM;Both;5 reps;10 reps Knee Flexion: AROM;Both;5 reps;10 reps Other Exercises Other  Exercises: Anterior weight shifting activities in sitting to facilitate improved transfer performance    General Comments        Pertinent Vitals/Pain Pain Assessment: Faces Pain Score: 6  Faces Pain Scale: Hurts even more Pain Location: L hip Pain Descriptors / Indicators: Aching;Guarding;Grimacing Pain Intervention(s): Premedicated before session;Monitored during session;Limited activity within patient's tolerance    Home Living                      Prior Function            PT Goals (current goals can now be found in the care plan section) Progress towards PT goals: Not progressing toward goals - comment(Pt limited by LLE pain with weight bearing)    Frequency    BID      PT Plan Current plan remains appropriate    Co-evaluation              AM-PAC PT "6 Clicks" Daily Activity  Outcome Measure                   End of Session Equipment Utilized During Treatment: Gait belt Activity Tolerance: Patient limited by pain Patient left: in bed;with bed alarm set;with call bell/phone within reach;with SCD's reapplied;with family/visitor present Nurse Communication: Mobility status PT Visit Diagnosis: Muscle weakness (generalized) (M62.81);Other abnormalities of gait and mobility (R26.89)     Time: 1610-9604 PT Time Calculation (min) (ACUTE ONLY): 25 min  Charges:  $Therapeutic Exercise: 23-37 mins $Therapeutic Activity: 23-37 mins                     D. Scott Janace Decker PT, DPT 06/16/18, 3:31 PM

## 2018-06-16 NOTE — Clinical Social Work Note (Signed)
CSW spoke with patient's granddaughter Cristal Ford at bedside to give bed offers. Joy states that they would prefer patient go to [facility] but they do not have a bed today. CSW spoke with Sue Lush at [facility] and she states that she may be able to offer tomorrow but not sure at this time. If patient is still here CSW will check with Sue Lush for possible bed offer tomorrow. Joy states that if [facility] is not available they will accept offer from [facility]. CSW will continue to follow for discharge planning.  Ruthe Mannan MSW, [address] [phone]

## 2018-06-16 NOTE — Progress Notes (Signed)
  Subjective: 2 Days Post-Op Procedure(s) (LRB): ARTHROPLASTY BIPOLAR HIP (HEMIARTHROPLASTY) (Left) Patient is unable to give me a pain score. Patient is demented with safety mittens in place. Plan is for discharge to SNF when medically appropriate. Negative for chest pain and shortness of breath Fever: No recent fever Gastrointestinal:Negative for nausea and vomiting  Objective: Vital signs in last 24 hours: Temp:  [97.4 F (36.3 C)-98.7 F (37.1 C)] 98.2 F (36.8 C) (11/03 2348) Pulse Rate:  [55-61] 55 (11/03 2348) Resp:  [24] 24 (11/03 2348) BP: (96-130)/(63-70) 117/70 (11/03 2348) SpO2:  [94 %-99 %] 99 % (11/03 2348)  Intake/Output from previous day:  Intake/Output Summary (Last 24 hours) at 06/16/2018 0730 Last data filed at 06/16/2018 0400 Gross per 24 hour  Intake 16109 ml  Output 125 ml  Net 43154 ml    Intake/Output this shift: No intake/output data recorded.  Labs: Recent Labs    06/13/18 1254 06/14/18 0438 06/16/18 0350  HGB 12.1 11.8* 9.1*   Recent Labs    06/14/18 0438 06/16/18 0350  WBC 12.3* 10.0  RBC 3.70* 2.85*  HCT 35.4* 28.1*  PLT 196 134*   Recent Labs    06/14/18 0438 06/15/18 0446  NA 137 136  K 3.3* 3.8  CL 105 109  CO2 21* 22  BUN 24* 26*  CREATININE 0.95 0.96  GLUCOSE 117* 105*  CALCIUM 8.9 8.0*   Recent Labs    06/13/18 1254  INR 1.00     EXAM General - Patient is Disorganized, Confused and Lacking Extremity - ABD soft Intact pulses distally Dorsiflexion/Plantar flexion intact Incision: dressing C/D/I No cellulitis present Dressing/Incision - clean, dry, no drainage Motor Function - intact, moving foot and toes well on exam.   Past Medical History:  Diagnosis Date  . Hypertension   . Knee pain     Assessment/Plan: 2 Days Post-Op Procedure(s) (LRB): ARTHROPLASTY BIPOLAR HIP (HEMIARTHROPLASTY) (Left) Active Problems:   Closed left hip fracture (HCC)  There is no height or weight on file to calculate  BMI. Advance diet Up with therapy D/C IV fluids when tolerating po intake.  Labs reviewed, WBC 10.0 this AM, no recent fevers. UA demonstrated signs of UTI, started on Rocephin. Up with therapy today as tolerated.  Plan for discharge to SNF when appropriate. Begin working on BM.  Upon discharge, continue on 81mg  aspirin twice daily for DVT prophylaxis. Continue antibiotics for UTI Staples can be removed by SNF on 06/30/18, follow-up with [facility] in 6 weeks for repeat x-rays.  DVT Prophylaxis - Aspirin, Foot Pumps and TED hose Weight-Bearing as tolerated to left leg  J. Horris Latino, PA-C [facility] 06/16/2018, 7:30 AM

## 2018-06-17 ENCOUNTER — Other Ambulatory Visit: Payer: Self-pay | Admitting: Adult Health

## 2018-06-17 ENCOUNTER — Encounter
Admission: RE | Admit: 2018-06-17 | Discharge: 2018-06-17 | Disposition: A | Discharge status: Home / Self Care | Payer: Medicare Other | Source: Ambulatory Visit | Attending: Internal Medicine | Admitting: Internal Medicine

## 2018-06-17 LAB — SURGICAL PATHOLOGY

## 2018-06-17 MED ORDER — ATENOLOL 25 MG PO TABS: 25.0000 mg | ORAL_TABLET | Freq: Every day | ORAL | 0 refills | Status: DC

## 2018-06-17 MED ORDER — OXYCODONE HCL 5 MG PO TABS: 5.0000 mg | ORAL_TABLET | Freq: Two times a day (BID) | ORAL | 0 refills | Status: DC | PRN

## 2018-06-17 MED ORDER — POLYETHYLENE GLYCOL 3350 17 G PO PACK: 17.0000 g | PACK | Freq: Every day | ORAL | 0 refills | Status: AC

## 2018-06-17 MED ORDER — MAGNESIUM CITRATE PO SOLN: 1.0000 | Freq: Once | ORAL | 0 refills | Status: DC | PRN

## 2018-06-17 MED ORDER — CEFUROXIME AXETIL 250 MG PO TABS: 250.0000 mg | ORAL_TABLET | Freq: Two times a day (BID) | ORAL | 0 refills | Status: AC

## 2018-06-17 MED ORDER — OXYCODONE HCL 5 MG PO TABS: ORAL_TABLET | ORAL | 0 refills | Status: DC

## 2018-06-17 MED ORDER — DOCUSATE SODIUM 100 MG PO CAPS: 100.0000 mg | ORAL_CAPSULE | Freq: Two times a day (BID) | ORAL | 0 refills | Status: DC

## 2018-06-17 MED ORDER — ASPIRIN 325 MG PO TBEC: 325.0000 mg | DELAYED_RELEASE_TABLET | Freq: Every day | ORAL | 0 refills | Status: DC

## 2018-06-17 NOTE — Progress Notes (Signed)
New referral for outpatient Palliative to follow at [facility] received from CSW [facility]. Plan is for discharge today. Patient information faxed to referral. Dayna Barker RN, BSN, [facility] and [facility], hospital Liaison [phone]

## 2018-06-17 NOTE — Care Management Important Message (Signed)
Important Message  Patient Details  Name: Jill Henderson MRN: [id number] Date of Birth: [birth year]   Medicare Important Message Given:  Yes    Olegario Messier A Drevon Plog 06/17/2018, 10:38 AM

## 2018-06-17 NOTE — Discharge Summary (Addendum)
[facility] [place] at [facility]   PATIENT NAME: Jill Henderson    MR#:  [id number]  DATE OF BIRTH:  1916-09-22  DATE OF ADMISSION:  06/13/2018 ADMITTING PHYSICIAN: Alford Highland, MD  DATE OF DISCHARGE: 06/17/2018   PRIMARY CARE PHYSICIAN: Oswaldo Conroy, MD    ADMISSION DIAGNOSIS:  Fall, initial encounter [W19.XXXA] Closed fracture of neck of left femur, initial encounter (HCC) [S72.002A] Acute pain of right shoulder [M25.511]  DISCHARGE DIAGNOSIS:  Active Problems:   Closed left hip fracture (HCC)   SECONDARY DIAGNOSIS:   Past Medical History:  Diagnosis Date  . Hypertension   . Knee pain     HOSPITAL COURSE:    1.  left hip fracture closed   No contraindications to surgery at this time. Patient not having any chest pain or shortness of breath. Manage per ortho/ s/p surgery 2. Hypertension. Blood pressure on the lower side. Hold Norvasc and cut back the dose of atenolol. Stable now. Started low dose atenolol. 3. Dehydration give very gentle IV fluids. 4. Impaired fasting glucose checked a hemoglobin A1c- 5.2 5. Fall- have UTI, also get PT eval. Will need rehab or likely long term placement. 6. UTI- rocephin and follow cx result. Improving symptomatically. Cefuroxime for 3 days.  DISCHARGE CONDITIONS:   Stable.  CONSULTS OBTAINED:  Treatment Team:  Kennedy Bucker, MD  DRUG ALLERGIES:  No Known Allergies  DISCHARGE MEDICATIONS:   Allergies as of 06/17/2018   No Known Allergies     Medication List    STOP taking these medications   amLODipine 10 MG tablet Commonly known as:  NORVASC     TAKE these medications   aspirin 325 MG EC tablet Take 1 tablet (325 mg total) by mouth daily with breakfast. Start taking on:  06/18/2018   atenolol 25 MG tablet Commonly known as:  TENORMIN Take 1 tablet (25 mg total) by mouth daily. Start taking on:  06/18/2018 What changed:    medication strength  how much to  take   cefUROXime 250 MG tablet Commonly known as:  CEFTIN Take 1 tablet (250 mg total) by mouth 2 (two) times daily for 3 days.   docusate sodium 100 MG capsule Commonly known as:  COLACE Take 1 capsule (100 mg total) by mouth 2 (two) times daily.   magnesium citrate Soln Take 296 mLs (1 Bottle total) by mouth once as needed for severe constipation.   OSTEO BI-FLEX REGULAR STRENGTH 250-200 MG Tabs Generic drug:  Glucosamine-Chondroitin Take 1 tablet by mouth daily.   oxyCODONE 5 MG immediate release tablet Commonly known as:  Oxy IR/ROXICODONE Take 1 tablet (5 mg total) by mouth 2 (two) times daily as needed for severe pain or breakthrough pain.   polyethylene glycol packet Commonly known as:  MIRALAX / GLYCOLAX Take 17 g by mouth daily. Hold , if loose stool or diarrhea Start taking on:  06/18/2018   PRESERVISION AREDS PO Take 1 tablet by mouth daily.   TYLENOL 8 HOUR ARTHRITIS PAIN 650 MG CR tablet Generic drug:  acetaminophen Take 650 mg by mouth 2 (two) times daily.        DISCHARGE INSTRUCTIONS:    Due to very old age, there are chances that  She may not have recovery from this surgery. If continue to get worse- please consider hospice in future. Family is aware and in agreement.  I recommend for palliative care nurse to follow at facility.  If you experience worsening of your admission  symptoms, develop shortness of breath, life threatening emergency, suicidal or homicidal thoughts you must seek medical attention immediately by calling 911 or calling your MD immediately  if symptoms less severe.  You Must read complete instructions/literature along with all the possible adverse reactions/side effects for all the Medicines you take and that have been prescribed to you. Take any new Medicines after you have completely understood and accept all the possible adverse reactions/side effects.   Please note  You were cared for by a hospitalist during your hospital  stay. If you have any questions about your discharge medications or the care you received while you were in the hospital after you are discharged, you can call the unit and asked to speak with the hospitalist on call if the hospitalist that took care of you is not available. Once you are discharged, your primary care physician will handle any further medical issues. Please note that NO REFILLS for any discharge medications will be authorized once you are discharged, as it is imperative that you return to your primary care physician (or establish a relationship with a primary care physician if you do not have one) for your aftercare needs so that they can reassess your need for medications and monitor your lab values.    Today   CHIEF COMPLAINT:   Chief Complaint  Patient presents with  . Fall    HISTORY OF PRESENT ILLNESS:  Jill Henderson  is a 84 y.o. female with a known history of after a fall today.  She states that she got up earlier and got out of bed and her knee was hurting and she was trying to use her walker and the walker hit the edge of the bed and she fell to the ground.  In the ER she was found to have a hip fracture.  Patient having severe pain in the left hip.  She lives alone.  She does have caregivers.  No complaints of chest pain or shortness of breath.   VITAL SIGNS:  Blood pressure (!) 159/68, pulse 67, temperature 98.7 F (37.1 C), temperature source Oral, resp. rate 17, weight 55.8 kg, SpO2 95 %.  I/O:    Intake/Output Summary (Last 24 hours) at 06/17/2018 1235 Last data filed at 06/17/2018 1000 Gross per 24 hour  Intake 1828.5 ml  Output 700 ml  Net 1128.5 ml    PHYSICAL EXAMINATION:   GENERAL:  84 y.o.-year-old patient lying in the bed with no acute distress.  EYES: Pupils equal, round, reactive to light and accommodation. No scleral icterus. Extraocular muscles intact.  HEENT: Head atraumatic, normocephalic. Oropharynx and nasopharynx clear.  NECK:  Supple, no  jugular venous distention. No thyroid enlargement, no tenderness.  LUNGS: Normal breath sounds bilaterally, no wheezing, rales,rhonchi or crepitation. No use of accessory muscles of respiration.  CARDIOVASCULAR: S1, S2 normal. No murmurs, rubs, or gallops.  ABDOMEN: Soft, nontender, nondistended. Bowel sounds present. No organomegaly or mass.  EXTREMITIES: No pedal edema, cyanosis, or clubbing.  NEUROLOGIC: Cranial nerves II through XII are intact. Muscle strength 2-3/5 in all extremities- LLL painful on movements . Sensation intact. Gait not checked.  PSYCHIATRIC: The patient is alert and oriented x 1.  SKIN: No obvious rash, lesion, or ulcer.   DATA REVIEW:   CBC Recent Labs  Lab 06/16/18 0350  WBC 10.0  HGB 9.1*  HCT 28.1*  PLT 134*    Chemistries  Recent Labs  Lab 06/15/18 0446  NA 136  K 3.8  CL 109  CO2 22  GLUCOSE 105*  BUN 26*  CREATININE 0.96  CALCIUM 8.0*    Cardiac Enzymes No results for input(s): TROPONINI in the last 168 hours.  Microbiology Results  Results for orders placed or performed during the hospital encounter of 06/13/18  Surgical pcr screen     Status: None   Collection Time: 06/14/18  6:50 AM  Result Value Ref Range Status   MRSA, PCR NEGATIVE NEGATIVE Final   Staphylococcus aureus NEGATIVE NEGATIVE Final    Comment: (NOTE) The Xpert SA Assay (FDA approved for NASAL specimens in patients 56 years of age and older), is one component of a comprehensive surveillance program. It is not intended to diagnose infection nor to guide or monitor treatment. Performed at [facility], [address], [place], [place] [address]   Urine Culture     Status: Abnormal (Preliminary result)   Collection Time: 06/15/18  4:42 AM  Result Value Ref Range Status   Specimen Description   Final    URINE, RANDOM Performed at [facility], [address], [place], [place] [address]    Special Requests   Final    NONE Performed at [facility], [address] [place], [place] [address]    Culture 30,000 COLONIES/mL ESCHERICHIA COLI (A)  Final   Report Status PENDING  Incomplete    RADIOLOGY:  No results found.  EKG:   Orders placed or performed during the hospital encounter of 06/13/18  . ED EKG  . ED EKG  . EKG 12-Lead  . EKG 12-Lead  . EKG      Management plans discussed with the patient, family and they are in agreement.  CODE STATUS:     Code Status Orders  (From admission, onward)         Start     Ordered   06/13/18 1435  Full code  Continuous     06/13/18 1434        Code Status History    This patient has a current code status but no historical code status.      TOTAL TIME TAKING CARE OF THIS PATIENT: 35 minutes.    Altamese Dilling M.D on 06/17/2018 at 12:35 PM  Between 7am to 6pm - Pager - [phone]  After 6pm go to [email or web address] - password [employer]  [facility]  Office  [phone]  CC: Primary care physician; Oswaldo Conroy, MD   Note: This dictation was prepared with Dragon dictation along with smaller phrase technology. Any transcriptional errors that result from this process are unintentional.

## 2018-06-17 NOTE — Clinical Social Work Placement (Signed)
   CLINICAL SOCIAL WORK PLACEMENT  NOTE  Date:  06/17/2018  Patient Details  Name: Jill Henderson MRN: [id number] Date of Birth: May 27, 1917  Clinical Social Work is seeking post-discharge placement for this patient at the Skilled  Nursing Facility level of care (*CSW will initial, date and re-position this form in  chart as items are completed):  Yes   Patient/family provided with [place] Clinical Social Work Department's list of facilities offering this level of care within the geographic area requested by the patient (or if unable, by the patient's family).  Yes   Patient/family informed of their freedom to choose among providers that offer the needed level of care, that participate in Medicare, Medicaid or managed care program needed by the patient, have an available bed and are willing to accept the patient.  Yes   Patient/family informed of [place]'s ownership interest in [facility] and [facility], as well as of the fact that they are under no obligation to receive care at these facilities.  PASRR submitted to EDS on 06/13/18     PASRR number received on 06/13/18     Existing PASRR number confirmed on       FL2 transmitted to all facilities in geographic area requested by pt/family on 06/13/18     FL2 transmitted to all facilities within larger geographic area on       Patient informed that his/her managed care company has contracts with or will negotiate with certain facilities, including the following:        Yes   Patient/family informed of bed offers received.  Patient chooses bed at [facility] )     Physician recommends and patient chooses bed at      Patient to be transferred to [facility] ) on 06/17/18.  Patient to be transferred to facility by [facility] )     Patient family notified on 06/17/18 of transfer.  Name of family member notified:  (Patient's granddaughter Jill Henderson is at bedside and aware of D/C today. )     PHYSICIAN      Additional Comment:    _______________________________________________ [name], Darleen Crocker, LCSW 06/17/2018, 2:29 PM

## 2018-06-17 NOTE — Discharge Instructions (Signed)
Palliative care nurse to follow at rehab place.

## 2018-06-17 NOTE — Progress Notes (Signed)
Physical Therapy Treatment Patient Details Name: Jill Henderson MRN: [id number] DOB: 09-04-1916 Today's Date: 06/17/2018    History of Present Illness Pt is a 84 y.o. female with a h/o HTN who presented after a fall.  She stated that she got up earlier and got out of bed and her knee was hurting and she was trying to use her walker and the walker hit the edge of the bed and she fell to the ground.  In the ER she was found to have a hip fracture.  Pt is now s/p L hip hemiarthroplasty.  Assessment also includes HTN and dehydration.       PT Comments    PT reviewed bed exercises with pt and encouraged her to do 3x/day.  Pt's granddaughter in room taking notes throughout treatment.  Pt very apprehensive and reporting pain in multiple parts of body, but able to perform quad sets and ankle pumps and demonstrate good carryover when asked to repeat exercises.  PT and NA assisted pt with bed mobility and changing of brief which required increased support of LE's by PT due to pt report of pain.  Pt reported pain of 7/10 during treatment.  She will continue to benefit from skilled PT with focus on strength, tolerance to activity and pain management.   Follow Up Recommendations  SNF     Equipment Recommendations  None recommended by PT    Recommendations for Other Services       Precautions / Restrictions Precautions Precautions: Fall;Anterior Hip Precaution Comments: No hip precautions per orders Restrictions Weight Bearing Restrictions: Yes LLE Weight Bearing: Weight bearing as tolerated    Mobility  Bed Mobility Overal bed mobility: Needs Assistance Bed Mobility: Rolling Rolling: Max assist         General bed mobility comments: PT and NA assisted pt in rolling to both sides, supporting LE's and with use of draw sheet, to change pt's brief.  Pt reported increased pain in knees several times.  Transfers                    Ambulation/Gait                 Stairs              Wheelchair Mobility    Modified Rankin (Stroke Patients Only)       Balance                                            Cognition Arousal/Alertness: Awake/alert Behavior During Therapy: WFL for tasks assessed/performed Overall Cognitive Status: History of cognitive impairments - at baseline                                 General Comments: Pt able to follow commands consistently.  Very HOH and requires directions to be repeated often.      Exercises Total Joint Exercises Ankle Circles/Pumps: 20 reps;Supine;Both(Education given to perform every hour when spirometer is used.) Quad Sets: Left;10 reps;Strengthening;Supine Other Exercises Other Exercises: Education regarding management of HEP and what to expect at SNF. x5 min Other Exercises: Assistance with bed mobility and changing of pt's brief. x10 min    General Comments        Pertinent Vitals/Pain Pain Assessment: 0-10 Pain Score: 7  Pain  Location: L hip, bilateral knees and entire R LE, bilateral shoulders. Pain Descriptors / Indicators: Aching;Moaning Pain Intervention(s): Limited activity within patient's tolerance;Monitored during session;Premedicated before session    Home Living                      Prior Function            PT Goals (current goals can now be found in the care plan section) Progress towards PT goals: PT to reassess next treatment(Pt faitgued and in pain following bed mobility for changing of brief.  Marland Kitchen)    Frequency    BID      PT Plan Current plan remains appropriate    Co-evaluation              AM-PAC PT "6 Clicks" Daily Activity  Outcome Measure  Difficulty turning over in bed (including adjusting bedclothes, sheets and blankets)?: A Lot Difficulty moving from lying on back to sitting on the side of the bed? : Unable Difficulty sitting down on and standing up from a chair with arms (e.g., wheelchair, bedside  commode, etc,.)?: Unable Help needed moving to and from a bed to chair (including a wheelchair)?: A Lot Help needed walking in hospital room?: Total Help needed climbing 3-5 steps with a railing? : Total 6 Click Score: 8    End of Session   Activity Tolerance: Patient limited by pain Patient left: in bed;with bed alarm set;with call bell/phone within reach;with SCD's reapplied;with family/visitor present(Social work in pt's room upon PT departure.) Nurse Communication: Mobility status PT Visit Diagnosis: Muscle weakness (generalized) (M62.81);Other abnormalities of gait and mobility (R26.89)     Time: 1610-9604 PT Time Calculation (min) (ACUTE ONLY): 28 min  Charges:  $Therapeutic Exercise: 8-22 mins $Therapeutic Activity: 8-22 mins                     Glenetta Hew, PT, DPT   Glenetta Hew 06/17/2018, 12:10 PM

## 2018-06-17 NOTE — Progress Notes (Signed)
Patient is medically stable for D/C to [facility] today. Per [facility] admissions coordinator at [facility] patient can come today to room 208-B. RN will call report at [phone] and EMS for transport. Clinical Child psychotherapist (CSW) sent D/C orders to [employer] via [employer]. Patient's granddaughter Ander Slade is at bedside and aware of above. MD ordered outpatient palliative care to follow patient. [facility] liaison is aware of above. Please reconsult if future social work needs arise. CSW signing off.   [employer], LCSW [phone]

## 2018-06-17 NOTE — Progress Notes (Signed)
[facility] [place] at [facility]   PATIENT NAME: Jill Henderson    MR#:  [id number]  DATE OF BIRTH:  09-04-1916  SUBJECTIVE:  CHIEF COMPLAINT:   Chief Complaint  Patient presents with  . Fall   Came after a fall, s/p surgery, no new complains.  REVIEW OF SYSTEMS:  CONSTITUTIONAL: No fever, fatigue or weakness.  EYES: No blurred or double vision.  EARS, NOSE, AND THROAT: No tinnitus or ear pain.  RESPIRATORY: No cough, shortness of breath, wheezing or hemoptysis.  CARDIOVASCULAR: No chest pain, orthopnea, edema.  GASTROINTESTINAL: No nausea, vomiting, diarrhea or abdominal pain.  GENITOURINARY: No dysuria, hematuria.  ENDOCRINE: No polyuria, nocturia,  HEMATOLOGY: No anemia, easy bruising or bleeding SKIN: No rash or lesion. MUSCULOSKELETAL: No joint pain or arthritis.  Left hip pain. NEUROLOGIC: No tingling, numbness, weakness.  PSYCHIATRY: No anxiety or depression.   ROS  DRUG ALLERGIES:  No Known Allergies  VITALS:  Blood pressure (!) 159/68, pulse 67, temperature 98.7 F (37.1 C), temperature source Oral, resp. rate 17, weight 55.8 kg, SpO2 95 %.  PHYSICAL EXAMINATION:  GENERAL:  84 y.o.-year-old patient lying in the bed with no acute distress.  EYES: Pupils equal, round, reactive to light and accommodation. No scleral icterus. Extraocular muscles intact.  HEENT: Head atraumatic, normocephalic. Oropharynx and nasopharynx clear.  NECK:  Supple, no jugular venous distention. No thyroid enlargement, no tenderness.  LUNGS: Normal breath sounds bilaterally, no wheezing, rales,rhonchi or crepitation. No use of accessory muscles of respiration.  CARDIOVASCULAR: S1, S2 normal. No murmurs, rubs, or gallops.  ABDOMEN: Soft, nontender, nondistended. Bowel sounds present. No organomegaly or mass.  EXTREMITIES: No pedal edema, cyanosis, or clubbing.  NEUROLOGIC: Cranial nerves II through XII are intact. Muscle strength 2-3/5 in all extremities- LLL painful on  movements . Sensation intact. Gait not checked.  PSYCHIATRIC: The patient is alert and oriented x 1.  SKIN: No obvious rash, lesion, or ulcer.   Physical Exam LABORATORY PANEL:   CBC Recent Labs  Lab 06/16/18 0350  WBC 10.0  HGB 9.1*  HCT 28.1*  PLT 134*   ------------------------------------------------------------------------------------------------------------------  Chemistries  Recent Labs  Lab 06/15/18 0446  NA 136  K 3.8  CL 109  CO2 22  GLUCOSE 105*  BUN 26*  CREATININE 0.96  CALCIUM 8.0*   ------------------------------------------------------------------------------------------------------------------  Cardiac Enzymes No results for input(s): TROPONINI in the last 168 hours. ------------------------------------------------------------------------------------------------------------------  RADIOLOGY:  No results found.  ASSESSMENT AND PLAN:   Active Problems:   Closed left hip fracture (HCC)  1.   left hip fracture closed    No contraindications to surgery at this time.  Patient not having any chest pain or shortness of breath. Manage per ortho/ s/p surgery 2.  Hypertension.  Blood pressure on the lower side.  Hold Norvasc and cut back the dose of atenolol. Stable now. 3.  Dehydration give very gentle IV fluids. 4.  Impaired fasting glucose checked a hemoglobin A1c- 5.2 5. Fall- have UTI, also get PT eval. Will need rehab or likely long term placement. 6. UTI- rocephin and follow cx result.   All the records are reviewed and case discussed with Care Management/Social Workerr. Management plans discussed with the patient, family and they are in agreement.  CODE STATUS: Full.  TOTAL TIME TAKING CARE OF THIS PATIENT: 35 minutes.   Discussed with Grand daughter in room.  POSSIBLE D/C IN 2-3 DAYS, DEPENDING ON CLINICAL CONDITION.   Altamese Dilling M.D on 06/17/2018   Between  7am to 6pm - Pager - [phone]  After 6pm go to  [email or web address] - password [employer]  [facility]  Office  [phone]  CC: Primary care physician; Oswaldo Conroy, MD  Note: This dictation was prepared with Dragon dictation along with smaller phrase technology. Any transcriptional errors that result from this process are unintentional.

## 2018-06-17 NOTE — Progress Notes (Signed)
Pt. Discharged to [facility] via EMS. Report called to [facility]. Patient assessment unchanged from this morning. VSS. IV discontinued per policy.

## 2018-06-17 NOTE — Progress Notes (Signed)
PT Cancellation Note  Patient Details Name: Jill Henderson MRN: [id number] DOB: 06-Jun-1917   Cancelled Treatment:    Reason Eval/Treat Not Completed: Patient declined, no reason specified.  Pt has just begun to eat her breakfast and requested time to finish.  PT will re-attempt when pt is available.   Glenetta Hew, PT, DPT 06/17/2018, 10:03 AM

## 2018-06-17 NOTE — Progress Notes (Signed)
Clinical Child psychotherapist (CSW) contacted [employer] at [facility] to see if they have a bed available. Per Sue Lush they do not have any beds available and will not be able to make a bed offer. CSW contacted patient's granddaughter Ander Slade and presented bed offers. She chose [facility]	[place] and okay with a semi-private room if that is all Kelby Aline has available. [facility] admissions coordinator at [facility] is aware of above.   [employer], LCSW [phone]

## 2018-06-17 NOTE — Progress Notes (Addendum)
  Subjective: 3 Days Post-Op Procedure(s) (LRB): ARTHROPLASTY BIPOLAR HIP (HEMIARTHROPLASTY) (Left) Patient reports mild to moderate pain. Patient is a difficult historian. Plan is for discharge to SNF when medically appropriate. Tolerating po well.  Objective: Vital signs in last 24 hours: Temp:  [97.8 F (36.6 C)-98.7 F (37.1 C)] 98.7 F (37.1 C) (11/05 0808) Pulse Rate:  [62-72] 67 (11/05 0808) Resp:  [16-17] 17 (11/05 0008) BP: (128-159)/(56-68) 159/68 (11/05 0808) SpO2:  [89 %-96 %] 95 % (11/05 0808)  Intake/Output from previous day:  Intake/Output Summary (Last 24 hours) at 06/17/2018 0941 Last data filed at 06/17/2018 0427 Gross per 24 hour  Intake -  Output 700 ml  Net -700 ml    Intake/Output this shift: No intake/output data recorded.  Labs: Recent Labs    06/16/18 0350  HGB 9.1*   Recent Labs    06/16/18 0350  WBC 10.0  RBC 2.85*  HCT 28.1*  PLT 134*   Recent Labs    06/15/18 0446  NA 136  K 3.8  CL 109  CO2 22  BUN 26*  CREATININE 0.96  GLUCOSE 105*  CALCIUM 8.0*   No results for input(s): LABPT, INR in the last 72 hours.   EXAM General - Patient is Disorganized, Confused and Lacking Extremity - ABD soft Intact pulses distally Dorsiflexion/Plantar flexion intact Incision: dressing C/D/I No cellulitis present Dressing/Incision - clean, dry, no drainage Motor Function - intact, moving foot and toes well on exam.   Past Medical History:  Diagnosis Date  . Hypertension   . Knee pain     Assessment/Plan: 3 Days Post-Op Procedure(s) (LRB): ARTHROPLASTY BIPOLAR HIP (HEMIARTHROPLASTY) (Left) Active Problems:   Closed left hip fracture (HCC)  There is no height or weight on file to calculate BMI. Advance diet Up with therapy D/C IV fluids when tolerating po intake.  Labs stable Up with therapy today as tolerated.  Plan for discharge to SNF when appropriate. Needs BM  Upon discharge, continue on 81mg  aspirin twice daily for  DVT prophylaxis. Continue antibiotics for UTI Remove staples on 06/30/18, follow-up with [facility] in 6 weeks for repeat x-rays.  DVT Prophylaxis - Aspirin, Foot Pumps and TED hose Weight-Bearing as tolerated to left leg  Janae Sauce, PA-C [facility] 06/17/2018, 9:41 AM

## 2018-06-18 ENCOUNTER — Encounter: Payer: Self-pay | Admitting: Adult Health

## 2018-06-18 ENCOUNTER — Non-Acute Institutional Stay (SKILLED_NURSING_FACILITY): Payer: Medicare Other | Admitting: Adult Health

## 2018-06-18 DIAGNOSIS — K5904 Chronic idiopathic constipation: Secondary | ICD-10-CM

## 2018-06-18 DIAGNOSIS — S72002S Fracture of unspecified part of neck of left femur, sequela: Secondary | ICD-10-CM | POA: Diagnosis not present

## 2018-06-18 DIAGNOSIS — N390 Urinary tract infection, site not specified: Secondary | ICD-10-CM | POA: Diagnosis not present

## 2018-06-18 DIAGNOSIS — I10 Essential (primary) hypertension: Secondary | ICD-10-CM

## 2018-06-18 LAB — URINE CULTURE

## 2018-06-18 NOTE — Progress Notes (Signed)
[facility]   [facility] Room Number: 208 B Place of Service:  SNF (31)   CODE STATUS: DNR  No Known Allergies  Chief Complaint  Patient presents with  . Hospitalization Follow-up    Hospital Follow up    HPI:  She is a 84 year old woman who has suffered a closed left hip fracture. She is very hard of hearing. She is here for short term rehab with her goal to return back home with family. She is unable to participate in the hpi or ros. There are no reports of uncontrolled pain; no agitation or anxiety; no changes in appetite. She will continue to be followed for her chronic illnesses including: hypertension; constipation; left hip fracture.   Past Medical History:  Diagnosis Date  . Hypertension   . Knee pain     Past Surgical History:  Procedure Laterality Date  . HIP ARTHROPLASTY Left 06/14/2018   Procedure: ARTHROPLASTY BIPOLAR HIP (HEMIARTHROPLASTY);  Surgeon: Kennedy Bucker, MD;  Location: [facility] ORS;  Service: Orthopedics;  Laterality: Left;  . NO PAST SURGERIES      Social History   Socioeconomic History  . Marital status: Widowed    Spouse name: Not on file  . Number of children: Not on file  . Years of education: Not on file  . Highest education level: Not on file  Occupational History  . Not on file  Social Needs  . Financial resource strain: Not on file  . Food insecurity:    Worry: Not on file    Inability: Not on file  . Transportation needs:    Medical: Not on file    Non-medical: Not on file  Tobacco Use  . Smoking status: Never Smoker  . Smokeless tobacco: Never Used  Substance and Sexual Activity  . Alcohol use: Never    Frequency: Never  . Drug use: Never  . Sexual activity: Not on file  Lifestyle  . Physical activity:    Days per week: Not on file    Minutes per session: Not on file  . Stress: Not on file  Relationships  . Social connections:    Talks on phone: Not on file    Gets together: Not on file   Attends religious service: Not on file    Active member of club or organization: Not on file    Attends meetings of clubs or organizations: Not on file    Relationship status: Not on file  . Intimate partner violence:    Fear of current or ex partner: Not on file    Emotionally abused: Not on file    Physically abused: Not on file    Forced sexual activity: Not on file  Other Topics Concern  . Not on file  Social History Narrative  . Not on file   Family History  Problem Relation Age of Onset  . Other Mother        Influenza      VITAL SIGNS BP (!) 155/96   Pulse 71   Temp 97.9 F (36.6 C)   Resp 16   Wt 123 lb (55.8 kg)   SpO2 95%   Outpatient Encounter Medications as of 06/18/2018  Medication Sig  . acetaminophen (TYLENOL 8 HOUR ARTHRITIS PAIN) 650 MG CR tablet Take 650 mg by mouth 2 (two) times daily.  Marland Kitchen aspirin EC 325 MG EC tablet Take 1 tablet (325 mg total) by mouth daily with breakfast.  . atenolol (TENORMIN) 25  MG tablet Take 1 tablet (25 mg total) by mouth daily.  . cefUROXime (CEFTIN) 250 MG tablet Take 1 tablet (250 mg total) by mouth 2 (two) times daily for 3 days.  Marland Kitchen docusate sodium (COLACE) 100 MG capsule Take 1 capsule (100 mg total) by mouth 2 (two) times daily.  . Glucosamine-Chondroitin (OSTEO BI-FLEX REGULAR STRENGTH) 250-200 MG TABS Take 1 tablet by mouth daily.  . magnesium citrate SOLN Take 296 mLs (1 Bottle total) by mouth once as needed for severe constipation.  . Multiple Vitamins-Minerals (PRESERVISION AREDS PO) Take 1 tablet by mouth daily.  . NON FORMULARY Diet Type: Regular  . oxyCODONE (OXY IR/ROXICODONE) 5 MG immediate release tablet 5 mg tabs take 1 tab twice daily as needed  . polyethylene glycol (MIRALAX / GLYCOLAX) packet Take 17 g by mouth daily. Hold , if loose stool or diarrhea   No facility-administered encounter medications on file as of 06/18/2018.      SIGNIFICANT DIAGNOSTIC EXAMS  TODAY:   06-13-18: pelvic with left hip  x-ray:  1. Varus angulated subcapital left femoral neck fracture with bilateral hip joint osteoarthritis. 2. Facet arthrosis L5-S1 bilaterally. 3. No acute pelvic fracture.  06-13-18: chest x-ray: Cardiomegaly with tortuous atherosclerotic aorta. No active pulmonary disease.  06-13-18: right shoulder x-ray: Degenerative changes without acute abnormality.  LABS REVIEWED TODAY:   06-13-18: wbc 12.4; hgb 12.1; hct 37.2; mcv 96.;6 plt 225; glucose 140; bun 29; creat 1.01; k+ 4.2; na++ 137; ca 9.2 hgb a1c 5.2 06-14-18: wbc 12.3; hgb 11.8; hct 35.4; mcv 95.7 plt 196 glucose 117; bun 24; creat 0.95; k+ 3.3; na++ 137; ca 8.9  06-15-18: glucose 105; bun 26; creat 0.96; k+ 3.8; na++ 136; ca 8.0  urine culture: 30,000 colonies e-coli  06-16-18: wbc 10.0; hgb 9.1; hct 28.1; mcv 98.6; plt 134   Review of Systems  Unable to perform ROS: Other (confusion )   Physical Exam  Constitutional: No distress.  Cachexia   Neck: No thyromegaly present.  Cardiovascular: Normal rate, regular rhythm, normal heart sounds and intact distal pulses.  Pulmonary/Chest: Effort normal and breath sounds normal. No respiratory distress.  Abdominal: Soft. Bowel sounds are normal. She exhibits no distension. There is no tenderness.  Musculoskeletal: She exhibits no edema.  Is able to move all extremities Is status post left femoral neck fracture   Lymphadenopathy:    She has no cervical adenopathy.  Neurological: She is alert.  Skin: Skin is warm and dry. She is not diaphoretic.  Psychiatric: She has a normal mood and affect.      ASSESSMENT/ PLAN:  TODAY:   1. Essential benign hypertension: is stable b/p 155/96; will lower tenormin to 25 mg daily due to bradycardia  2. UTI: stable had 30,000 colonies e-coli will complete ceftin and will monitor  3. Chronic idiopathic constipation; is stable will continue miralax daily and colace twice daily   4. Closed left hip fracture: is stable will continue therapy as  directed and will follow up with orthopedics; will continue tylenol 650 mg twice daily asa 325 mg daily and oxycodone 5 mg twice daily as needed   Will check cbc 06-20-18.        MD is aware of resident's narcotic use and is in agreement with current plan of care. We will attempt to wean resident as apropriate   Synthia Innocent NP [facility]  Contact [phone] Monday through Friday 8am- 5pm  After hours call [phone]

## 2018-06-20 ENCOUNTER — Other Ambulatory Visit
Admission: RE | Admit: 2018-06-20 | Discharge: 2018-06-20 | Disposition: A | Discharge status: Home / Self Care | Payer: No Typology Code available for payment source | Source: Ambulatory Visit | Attending: Adult Health | Admitting: Adult Health

## 2018-06-20 ENCOUNTER — Other Ambulatory Visit
Admission: RE | Admit: 2018-06-20 | Discharge: 2018-06-20 | Disposition: A | Discharge status: Home / Self Care | Payer: No Typology Code available for payment source | Source: Skilled Nursing Facility | Attending: Adult Health | Admitting: Adult Health

## 2018-06-20 DIAGNOSIS — R531 Weakness: Secondary | ICD-10-CM | POA: Insufficient documentation

## 2018-06-20 LAB — CBC WITH DIFFERENTIAL/PLATELET
Abs Immature Granulocytes: 0.11 10*3/uL — ABNORMAL HIGH (ref 0.00–0.07)
BASOS ABS: 0.1 10*3/uL (ref 0.0–0.1)
Basophils Relative: 1 %
EOS ABS: 0.4 10*3/uL (ref 0.0–0.5)
EOS PCT: 3 %
HCT: 29.3 % — ABNORMAL LOW (ref 36.0–46.0)
HEMOGLOBIN: 9.5 g/dL — AB (ref 12.0–15.0)
IMMATURE GRANULOCYTES: 1 %
LYMPHS ABS: 1.1 10*3/uL (ref 0.7–4.0)
LYMPHS PCT: 8 %
MCH: 31.1 pg (ref 26.0–34.0)
MCHC: 32.4 g/dL (ref 30.0–36.0)
MCV: 96.1 fL (ref 80.0–100.0)
Monocytes Absolute: 1.1 10*3/uL — ABNORMAL HIGH (ref 0.1–1.0)
Monocytes Relative: 8 %
NEUTROS PCT: 79 %
Neutro Abs: 10.3 10*3/uL — ABNORMAL HIGH (ref 1.7–7.7)
Platelets: 303 10*3/uL (ref 150–400)
RBC: 3.05 MIL/uL — AB (ref 3.87–5.11)
RDW: 12.7 % (ref 11.5–15.5)
WBC: 13.1 10*3/uL — AB (ref 4.0–10.5)
nRBC: 0 % (ref 0.0–0.2)

## 2018-06-21 DIAGNOSIS — I10 Essential (primary) hypertension: Secondary | ICD-10-CM | POA: Insufficient documentation

## 2018-06-21 DIAGNOSIS — N390 Urinary tract infection, site not specified: Secondary | ICD-10-CM | POA: Insufficient documentation

## 2018-06-21 DIAGNOSIS — K5904 Chronic idiopathic constipation: Secondary | ICD-10-CM | POA: Insufficient documentation

## 2018-06-24 ENCOUNTER — Non-Acute Institutional Stay (SKILLED_NURSING_FACILITY): Payer: Medicare Other | Admitting: Adult Health

## 2018-06-24 ENCOUNTER — Encounter: Payer: Self-pay | Admitting: Adult Health

## 2018-06-24 DIAGNOSIS — S72002S Fracture of unspecified part of neck of left femur, sequela: Secondary | ICD-10-CM

## 2018-06-24 DIAGNOSIS — K5904 Chronic idiopathic constipation: Secondary | ICD-10-CM | POA: Diagnosis not present

## 2018-06-24 DIAGNOSIS — I10 Essential (primary) hypertension: Secondary | ICD-10-CM | POA: Diagnosis not present

## 2018-06-24 NOTE — Progress Notes (Signed)
[facility] Room Number: 208 B Place of Service:  SNF (31)   CODE STATUS: DNR  No Known Allergies  Chief Complaint  Patient presents with  . Medical Management of Chronic Issues    Essential hypertension benign; chronic idiopathic constipation; closed fracture of left hip sequela. Weekly follow up for the 30 days post hospitalization     HPI:  She is a 84 year old short term rehab patient being seen for the management of her chronic illnesses; hypertension; constipation; left hip fracture. She is unable to fully participate in the hpi or ros. There are no reports of uncontrolled pain; no anxiety or agitation. She is having loose stools at this time. Her family is present; the goal of her care at this time includes possible assisted living vs long term snf placement.   Past Medical History:  Diagnosis Date  . Hypertension   . Knee pain     Past Surgical History:  Procedure Laterality Date  . HIP ARTHROPLASTY Left 06/14/2018   Procedure: ARTHROPLASTY BIPOLAR HIP (HEMIARTHROPLASTY);  Surgeon: Kennedy BuckerMenz, Michael, MD;  Location: [facility] ORS;  Service: Orthopedics;  Laterality: Left;  . NO PAST SURGERIES      Social History   Socioeconomic History  . Marital status: Widowed    Spouse name: Not on file  . Number of children: Not on file  . Years of education: Not on file  . Highest education level: Not on file  Occupational History  . Not on file  Social Needs  . Financial resource strain: Not on file  . Food insecurity:    Worry: Not on file    Inability: Not on file  . Transportation needs:    Medical: Not on file    Non-medical: Not on file  Tobacco Use  . Smoking status: Never Smoker  . Smokeless tobacco: Never Used  Substance and Sexual Activity  . Alcohol use: Never    Frequency: Never  . Drug use: Never  . Sexual activity: Not on file  Lifestyle  . Physical activity:    Days per week: Not on file    Minutes per session: Not  on file  . Stress: Not on file  Relationships  . Social connections:    Talks on phone: Not on file    Gets together: Not on file    Attends religious service: Not on file    Active member of club or organization: Not on file    Attends meetings of clubs or organizations: Not on file    Relationship status: Not on file  . Intimate partner violence:    Fear of current or ex partner: Not on file    Emotionally abused: Not on file    Physically abused: Not on file    Forced sexual activity: Not on file  Other Topics Concern  . Not on file  Social History Narrative  . Not on file   Family History  Problem Relation Age of Onset  . Other Mother        Influenza      VITAL SIGNS BP 120/69   Pulse 84   Temp 98.9 F (37.2 C)   Resp 17   Ht 5\' 2"  (1.575 m)   Wt 126 lb (57.2 kg)   SpO2 96%   BMI 23.05 kg/m   Outpatient Encounter Medications as of 06/24/2018  Medication Sig  . acetaminophen (TYLENOL 8 HOUR ARTHRITIS PAIN) 650 MG CR tablet Take  650 mg by mouth 2 (two) times daily.  Marland Kitchen aspirin 325 MG tablet Take 325 mg by mouth 2 (two) times daily.  Marland Kitchen atenolol (TENORMIN) 25 MG tablet Take 12.5 mg by mouth daily.  . Cholecalciferol 100 MCG (4000 UT) CAPS Take 4,000 Units by mouth daily.  Marland Kitchen docusate sodium (COLACE) 100 MG capsule Take 1 capsule (100 mg total) by mouth 2 (two) times daily.  . Glucosamine-Chondroitin (OSTEO BI-FLEX REGULAR STRENGTH) 250-200 MG TABS Take 1 tablet by mouth daily.  . Infant Care Products [facility] EX) Apply liberal amount topically to area of skin irritation as needed.  Ok to leave at bedside  . Multiple Vitamins-Minerals (PRESERVISION AREDS PO) Take 1 tablet by mouth daily.  . NON FORMULARY Diet Type: Regular  . oxyCODONE (OXY IR/ROXICODONE) 5 MG immediate release tablet 5 mg tabs take 1 tab twice daily as needed  . polyethylene glycol (MIRALAX / GLYCOLAX) packet Take 17 g by mouth daily. Hold , if loose stool or diarrhea  . senna (SENOKOT) 8.6 MG  TABS tablet Take 1 tablet by mouth 2 (two) times daily.   No facility-administered encounter medications on file as of 06/24/2018.      SIGNIFICANT DIAGNOSTIC EXAMS  PREVIOUS:   06-13-18: pelvic with left hip x-ray:  1. Varus angulated subcapital left femoral neck fracture with bilateral hip joint osteoarthritis. 2. Facet arthrosis L5-S1 bilaterally. 3. No acute pelvic fracture.  06-13-18: chest x-ray: Cardiomegaly with tortuous atherosclerotic aorta. No active pulmonary disease.  06-13-18: right shoulder x-ray: Degenerative changes without acute abnormality.  NO NEW EXAMS.   LABS REVIEWED PREVIOUS:   06-13-18: wbc 12.4; hgb 12.1; hct 37.2; mcv 96.;6 plt 225; glucose 140; bun 29; creat 1.01; k+ 4.2; na++ 137; ca 9.2 hgb a1c 5.2 06-14-18: wbc 12.3; hgb 11.8; hct 35.4; mcv 95.7 plt 196 glucose 117; bun 24; creat 0.95; k+ 3.3; na++ 137; ca 8.9  06-15-18: glucose 105; bun 26; creat 0.96; k+ 3.8; na++ 136; ca 8.0  urine culture: 30,000 colonies e-coli  06-16-18: wbc 10.0; hgb 9.1; hct 28.1; mcv 98.6; plt 134  TODAY:   06-20-18: wbc 13.1; hgb 9.5; hct 29.3; mcv 96.1; plt 303   Review of Systems  Unable to perform ROS: Other (confusion )     Physical Exam  Constitutional: No distress.  Cachexia   Neck: No thyromegaly present.  Cardiovascular: Normal rate, regular rhythm, normal heart sounds and intact distal pulses.  Pulmonary/Chest: Effort normal and breath sounds normal. No respiratory distress.  Abdominal: Soft. Bowel sounds are normal. She exhibits no distension. There is no tenderness.  Musculoskeletal: She exhibits no edema.  Is able to move all extremities Is status post left femoral neck fracture    Lymphadenopathy:    She has no cervical adenopathy.  Neurological: She is alert.  Skin: Skin is warm and dry. She is not diaphoretic.  Psychiatric: She has a normal mood and affect.      ASSESSMENT/ PLAN:  TODAY:   1. Essential benign hypertension: is stable b/p  120/69; will continue 12.5 mg daily due to bradycardia  2. Chronic idiopathic constipation; is having loose stools will make her miralax colace and senna as needed   3. Closed left hip fracture: is stable will continue therapy as directed and will follow up with orthopedics; will continue tylenol 650 mg twice daily asa 325 mg daily and oxycodone 5 mg twice daily as needed    MD is aware of resident's narcotic use and is in agreement with current  plan of care. We will attempt to wean resident as apropriate   Ok Edwards NP [facility]  Contact [phone] Monday through Friday 8am- 5pm  After hours call [phone]

## 2018-07-01 ENCOUNTER — Encounter: Payer: Self-pay | Admitting: Adult Health

## 2018-07-01 ENCOUNTER — Non-Acute Institutional Stay (SKILLED_NURSING_FACILITY): Payer: Medicare Other | Admitting: Adult Health

## 2018-07-01 DIAGNOSIS — F0150 Vascular dementia without behavioral disturbance: Secondary | ICD-10-CM | POA: Diagnosis not present

## 2018-07-01 DIAGNOSIS — I10 Essential (primary) hypertension: Secondary | ICD-10-CM

## 2018-07-01 DIAGNOSIS — S72002S Fracture of unspecified part of neck of left femur, sequela: Secondary | ICD-10-CM | POA: Diagnosis not present

## 2018-07-01 NOTE — Progress Notes (Signed)
[facility]   [facility] Room Number: 208 B Place of Service:  SNF (31)   CODE STATUS: DNR  No Known Allergies  Chief Complaint  Patient presents with  . Medical Management of Chronic Issues    Closed fracture left hip sequela; essential hypertension benign; vascular dementia without behavioral disturbance.  Weekly follow up for the first 30 days post hospitalization.     HPI:  She is a 84 year old short term rehab patient being seen for the management of her chronic illnesses: left hip fracture; dementia; hypertension she is unable to participate in the hpi or ros. There are no reports of uncontrolled hip pain; no anxiety or insomnia. She does have a poor appetite.   Past Medical History:  Diagnosis Date  . Hypertension   . Knee pain     Past Surgical History:  Procedure Laterality Date  . HIP ARTHROPLASTY Left 06/14/2018   Procedure: ARTHROPLASTY BIPOLAR HIP (HEMIARTHROPLASTY);  Surgeon: Kennedy Bucker, MD;  Location: [facility] ORS;  Service: Orthopedics;  Laterality: Left;  . NO PAST SURGERIES      Social History   Socioeconomic History  . Marital status: Widowed    Spouse name: Not on file  . Number of children: Not on file  . Years of education: Not on file  . Highest education level: Not on file  Occupational History  . Not on file  Social Needs  . Financial resource strain: Not on file  . Food insecurity:    Worry: Not on file    Inability: Not on file  . Transportation needs:    Medical: Not on file    Non-medical: Not on file  Tobacco Use  . Smoking status: Never Smoker  . Smokeless tobacco: Never Used  Substance and Sexual Activity  . Alcohol use: Never    Frequency: Never  . Drug use: Never  . Sexual activity: Not on file  Lifestyle  . Physical activity:    Days per week: Not on file    Minutes per session: Not on file  . Stress: Not on file  Relationships  . Social connections:    Talks on phone: Not on file    Gets  together: Not on file    Attends religious service: Not on file    Active member of club or organization: Not on file    Attends meetings of clubs or organizations: Not on file    Relationship status: Not on file  . Intimate partner violence:    Fear of current or ex partner: Not on file    Emotionally abused: Not on file    Physically abused: Not on file    Forced sexual activity: Not on file  Other Topics Concern  . Not on file  Social History Narrative  . Not on file   Family History  Problem Relation Age of Onset  . Other Mother        Influenza      VITAL SIGNS BP 104/67   Pulse 65   Temp 98.3 F (36.8 C)   Resp 18   Ht 5\' 2"  (1.575 m)   Wt 122 lb 9.6 oz (55.6 kg)   SpO2 99%   BMI 22.42 kg/m   Outpatient Encounter Medications as of 07/01/2018  Medication Sig  . acetaminophen (TYLENOL 8 HOUR ARTHRITIS PAIN) 650 MG CR tablet Take 650 mg by mouth 2 (two) times daily.  Marland Kitchen aspirin 325 MG tablet Take 325 mg by  mouth 2 (two) times daily.  Marland Kitchen. atenolol (TENORMIN) 25 MG tablet Take 12.5 mg by mouth daily.  . Cholecalciferol 100 MCG (4000 UT) CAPS Take 4,000 Units by mouth daily.  Marland Kitchen. docusate sodium (COLACE) 100 MG capsule Take 100 mg by mouth 2 (two) times daily as needed for mild constipation.  . Glucosamine-Chondroitin (OSTEO BI-FLEX REGULAR STRENGTH) 250-200 MG TABS Take 1 tablet by mouth daily.  . Infant Care Products [facility] EX) Apply liberal amount topically to area of skin irritation as needed.  Ok to leave at bedside  . Multiple Vitamins-Minerals (PRESERVISION AREDS PO) Take 1 tablet by mouth daily.  . NON FORMULARY Diet Type: Regular  . oxyCODONE (OXY IR/ROXICODONE) 5 MG immediate release tablet 5 mg tabs take 1 tab twice daily as needed  . polyethylene glycol (MIRALAX / GLYCOLAX) packet Take 17 g by mouth daily. Hold , if loose stool or diarrhea  . senna (SENOKOT) 8.6 MG TABS tablet Take 1 tablet by mouth daily as needed.   . [DISCONTINUED] docusate sodium  (COLACE) 100 MG capsule Take 1 capsule (100 mg total) by mouth 2 (two) times daily. (Patient not taking: Reported on 07/01/2018)   No facility-administered encounter medications on file as of 07/01/2018.      SIGNIFICANT DIAGNOSTIC EXAMS  PREVIOUS:   06-13-18: pelvic with left hip x-ray:  1. Varus angulated subcapital left femoral neck fracture with bilateral hip joint osteoarthritis. 2. Facet arthrosis L5-S1 bilaterally. 3. No acute pelvic fracture.  06-13-18: chest x-ray: Cardiomegaly with tortuous atherosclerotic aorta. No active pulmonary disease.  06-13-18: right shoulder x-ray: Degenerative changes without acute abnormality.  NO NEW EXAMS.   LABS REVIEWED PREVIOUS:   06-13-18: wbc 12.4; hgb 12.1; hct 37.2; mcv 96.;6 plt 225; glucose 140; bun 29; creat 1.01; k+ 4.2; na++ 137; ca 9.2 hgb a1c 5.2 06-14-18: wbc 12.3; hgb 11.8; hct 35.4; mcv 95.7 plt 196 glucose 117; bun 24; creat 0.95; k+ 3.3; na++ 137; ca 8.9  06-15-18: glucose 105; bun 26; creat 0.96; k+ 3.8; na++ 136; ca 8.0  urine culture: 30,000 colonies e-coli  06-16-18: wbc 10.0; hgb 9.1; hct 28.1; mcv 98.6; plt 134 06-20-18: wbc 13.1; hgb 9.5; hct 29.3; mcv 96.1; plt 303   NO NEW LABS.    Review of Systems  Unable to perform ROS: Dementia (confusion)    Physical Exam  Constitutional: No distress.  Cachexia   Neck: No thyromegaly present.  Cardiovascular: Normal rate, regular rhythm, normal heart sounds and intact distal pulses.  Pulmonary/Chest: Effort normal and breath sounds normal. No respiratory distress.  Abdominal: Soft. Bowel sounds are normal. She exhibits no distension. There is no tenderness.  Musculoskeletal: She exhibits no edema.  Is able to move all extremities Is status post left femoral neck fracture   Lymphadenopathy:    She has no cervical adenopathy.  Neurological: She is alert.  Skin: Skin is warm and dry. She is not diaphoretic.  Psychiatric: She has a normal mood and affect.      ASSESSMENT/ PLAN:  TODAY:   1. Essential benign hypertension: is stable b/p 104/67; will continue atenolol 12.5 mg daily due to bradycardia  2. Chronic idiopathic constipation; is stable will continue miralax daily and senna daily as needed   3. Closed left hip fracture: is stable will continue therapy as directed and will follow up with orthopedics; will continue tylenol 650 mg twice daily asa 325 mg daily and oxycodone 5 mg twice daily as needed   4. Vascular dementia without behavioral disturbance:  is without change weight is 122 pounds; will monitor her status.     MD is aware of resident's narcotic use and is in agreement with current plan of care. We will attempt to wean resident as apropriate   Synthia Innocent NP [facility]  Contact [phone] Monday through Friday 8am- 5pm  After hours call [phone]

## 2018-07-03 ENCOUNTER — Encounter: Payer: Self-pay | Admitting: Adult Health

## 2018-07-03 ENCOUNTER — Non-Acute Institutional Stay (SKILLED_NURSING_FACILITY): Payer: Medicare Other | Admitting: Adult Health

## 2018-07-03 ENCOUNTER — Other Ambulatory Visit
Admission: RE | Admit: 2018-07-03 | Discharge: 2018-07-03 | Disposition: A | Discharge status: Home / Self Care | Payer: No Typology Code available for payment source | Source: Ambulatory Visit | Attending: Adult Health | Admitting: Adult Health

## 2018-07-03 DIAGNOSIS — S72002S Fracture of unspecified part of neck of left femur, sequela: Secondary | ICD-10-CM | POA: Diagnosis not present

## 2018-07-03 DIAGNOSIS — F0150 Vascular dementia without behavioral disturbance: Secondary | ICD-10-CM | POA: Diagnosis not present

## 2018-07-03 DIAGNOSIS — I10 Essential (primary) hypertension: Secondary | ICD-10-CM | POA: Insufficient documentation

## 2018-07-03 LAB — BASIC METABOLIC PANEL
ANION GAP: 11 (ref 5–15)
BUN: 23 mg/dL (ref 8–23)
CHLORIDE: 104 mmol/L (ref 98–111)
CO2: 23 mmol/L (ref 22–32)
Calcium: 8.6 mg/dL — ABNORMAL LOW (ref 8.9–10.3)
Creatinine, Ser: 1.09 mg/dL — ABNORMAL HIGH (ref 0.44–1.00)
GFR calc non Af Amer: 41 mL/min — ABNORMAL LOW (ref >60)
GFR, EST AFRICAN AMERICAN: 47 mL/min — AB (ref >60)
GLUCOSE: 87 mg/dL (ref 70–99)
Potassium: 4.3 mmol/L (ref 3.5–5.1)
Sodium: 138 mmol/L (ref 135–145)

## 2018-07-03 NOTE — Progress Notes (Signed)
[facility]   [facility] Room Number: 208 B Place of Service:  SNF (31)   CODE STATUS: DNR  No Known Allergies  Chief Complaint  Patient presents with  . Acute Visit    Change in status    HPI:  She is lethargic today and is unable to wake up. She has had little po intake today. She is unable to participate in the phi or ros. There are no reports  Of fevers present. She did receive an oxycodone last night for pain. She is not used to this type of pain medication. This does appear to a medication effective. I have spoken with her family and will stop this medications.   Past Medical History:  Diagnosis Date  . Hypertension   . Knee pain     Past Surgical History:  Procedure Laterality Date  . HIP ARTHROPLASTY Left 06/14/2018   Procedure: ARTHROPLASTY BIPOLAR HIP (HEMIARTHROPLASTY);  Surgeon: Kennedy BuckerMenz, Michael, MD;  Location: [facility] ORS;  Service: Orthopedics;  Laterality: Left;  . NO PAST SURGERIES      Social History   Socioeconomic History  . Marital status: Widowed    Spouse name: Not on file  . Number of children: Not on file  . Years of education: Not on file  . Highest education level: Not on file  Occupational History  . Not on file  Social Needs  . Financial resource strain: Not on file  . Food insecurity:    Worry: Not on file    Inability: Not on file  . Transportation needs:    Medical: Not on file    Non-medical: Not on file  Tobacco Use  . Smoking status: Never Smoker  . Smokeless tobacco: Never Used  Substance and Sexual Activity  . Alcohol use: Never    Frequency: Never  . Drug use: Never  . Sexual activity: Not on file  Lifestyle  . Physical activity:    Days per week: Not on file    Minutes per session: Not on file  . Stress: Not on file  Relationships  . Social connections:    Talks on phone: Not on file    Gets together: Not on file    Attends religious service: Not on file    Active member of club or  organization: Not on file    Attends meetings of clubs or organizations: Not on file    Relationship status: Not on file  . Intimate partner violence:    Fear of current or ex partner: Not on file    Emotionally abused: Not on file    Physically abused: Not on file    Forced sexual activity: Not on file  Other Topics Concern  . Not on file  Social History Narrative  . Not on file   Family History  Problem Relation Age of Onset  . Other Mother        Influenza      VITAL SIGNS BP (!) 145/59   Pulse (!) 59   Temp 97.8 F (36.6 C)   Resp 16   Ht 5\' 2"  (1.575 m)   Wt 118 lb 14.4 oz (53.9 kg)   SpO2 99%   BMI 21.75 kg/m   Outpatient Encounter Medications as of 07/03/2018  Medication Sig  . acetaminophen (TYLENOL) 500 MG tablet Take 500 mg by mouth 3 (three) times daily.  Marland Kitchen. aspirin 325 MG tablet Take 325 mg by mouth 2 (two) times daily.  Marland Kitchen. atenolol (  TENORMIN) 25 MG tablet Take 12.5 mg by mouth daily.  Marland Kitchen docusate sodium (COLACE) 100 MG capsule Take 100 mg by mouth 2 (two) times daily as needed for mild constipation.  . Infant Care Products [facility] EX) Apply liberal amount topically to area of skin irritation as needed.  Ok to leave at bedside  . NON FORMULARY Diet Type: Regular  . Nutritional Supplements (ENSURE ENLIVE PO) Take 1 Bottle by mouth 2 (two) times daily.  . polyethylene glycol (MIRALAX / GLYCOLAX) packet Take 17 g by mouth daily. Hold , if loose stool or diarrhea  . senna (SENOKOT) 8.6 MG TABS tablet Take 1 tablet by mouth 2 (two) times daily as needed.   . [DISCONTINUED] acetaminophen (TYLENOL 8 HOUR ARTHRITIS PAIN) 650 MG CR tablet Take 650 mg by mouth 2 (two) times daily.  . [DISCONTINUED] Cholecalciferol 100 MCG (4000 UT) CAPS Take 4,000 Units by mouth daily.  . [DISCONTINUED] Glucosamine-Chondroitin (OSTEO BI-FLEX REGULAR STRENGTH) 250-200 MG TABS Take 1 tablet by mouth daily.  . [DISCONTINUED] Multiple Vitamins-Minerals (PRESERVISION AREDS PO) Take 1  tablet by mouth daily.  . [DISCONTINUED] oxyCODONE (OXY IR/ROXICODONE) 5 MG immediate release tablet 5 mg tabs take 1 tab twice daily as needed (Patient not taking: Reported on 07/03/2018)   No facility-administered encounter medications on file as of 07/03/2018.      SIGNIFICANT DIAGNOSTIC EXAMS   PREVIOUS:   06-13-18: pelvic with left hip x-ray:  1. Varus angulated subcapital left femoral neck fracture with bilateral hip joint osteoarthritis. 2. Facet arthrosis L5-S1 bilaterally. 3. No acute pelvic fracture.  06-13-18: chest x-ray: Cardiomegaly with tortuous atherosclerotic aorta. No active pulmonary disease.  06-13-18: right shoulder x-ray: Degenerative changes without acute abnormality.  NO NEW EXAMS.   LABS REVIEWED PREVIOUS:   06-13-18: wbc 12.4; hgb 12.1; hct 37.2; mcv 96.;6 plt 225; glucose 140; bun 29; creat 1.01; k+ 4.2; na++ 137; ca 9.2 hgb a1c 5.2 06-14-18: wbc 12.3; hgb 11.8; hct 35.4; mcv 95.7 plt 196 glucose 117; bun 24; creat 0.95; k+ 3.3; na++ 137; ca 8.9  06-15-18: glucose 105; bun 26; creat 0.96; k+ 3.8; na++ 136; ca 8.0  urine culture: 30,000 colonies e-coli  06-16-18: wbc 10.0; hgb 9.1; hct 28.1; mcv 98.6; plt 134 06-20-18: wbc 13.1; hgb 9.5; hct 29.3; mcv 96.1; plt 303   NO NEW LABS.   Review of Systems  Unable to perform ROS: Dementia (confusion )    Physical Exam  Constitutional: No distress.  Cachexia   Neck: No thyromegaly present.  Cardiovascular: Normal rate, regular rhythm, normal heart sounds and intact distal pulses.  Pulmonary/Chest: Effort normal and breath sounds normal. No respiratory distress.  Abdominal: Soft. Bowel sounds are normal. She exhibits no distension. There is no tenderness.  Musculoskeletal: She exhibits no edema.  Is able to move all extremities Is status post left femoral neck fracture    Lymphadenopathy:    She has no cervical adenopathy.  Neurological:  Is aware   Skin: Skin is warm and dry. She is not diaphoretic.      ASSESSMENT/ PLAN:  TODAY:   1. Closed fracture of left hip, sequela 2. Vascular dementia without behavioral disturbance  Will stop her oxycodone and vitamins to help reduce pill load Will check cbc; bmp    MD is aware of resident's narcotic use and is in agreement with current plan of care. We will attempt to wean resident as apropriate   Synthia Innocent NP [facility]  Contact [phone] Monday through Friday  8am- 5pm  After hours call [phone]

## 2018-07-04 ENCOUNTER — Other Ambulatory Visit
Admission: RE | Admit: 2018-07-04 | Discharge: 2018-07-04 | Disposition: A | Discharge status: Home / Self Care | Payer: No Typology Code available for payment source | Source: Skilled Nursing Facility | Attending: Adult Health | Admitting: Adult Health

## 2018-07-04 DIAGNOSIS — D649 Anemia, unspecified: Secondary | ICD-10-CM | POA: Insufficient documentation

## 2018-07-04 DIAGNOSIS — F0150 Vascular dementia without behavioral disturbance: Secondary | ICD-10-CM | POA: Insufficient documentation

## 2018-07-04 LAB — CBC WITH DIFFERENTIAL/PLATELET
Abs Immature Granulocytes: 0.09 10*3/uL — ABNORMAL HIGH (ref 0.00–0.07)
Basophils Absolute: 0.1 10*3/uL (ref 0.0–0.1)
Basophils Relative: 1 %
EOS ABS: 0.5 10*3/uL (ref 0.0–0.5)
EOS PCT: 5 %
HCT: 31.4 % — ABNORMAL LOW (ref 36.0–46.0)
Hemoglobin: 9.7 g/dL — ABNORMAL LOW (ref 12.0–15.0)
IMMATURE GRANULOCYTES: 1 %
Lymphocytes Relative: 26 %
Lymphs Abs: 2.5 10*3/uL (ref 0.7–4.0)
MCH: 30.3 pg (ref 26.0–34.0)
MCHC: 30.9 g/dL (ref 30.0–36.0)
MCV: 98.1 fL (ref 80.0–100.0)
MONO ABS: 1 10*3/uL (ref 0.1–1.0)
MONOS PCT: 10 %
Neutro Abs: 5.7 10*3/uL (ref 1.7–7.7)
Neutrophils Relative %: 57 %
Platelets: 309 10*3/uL (ref 150–400)
RBC: 3.2 MIL/uL — ABNORMAL LOW (ref 3.87–5.11)
RDW: 13.5 % (ref 11.5–15.5)
WBC: 9.8 10*3/uL (ref 4.0–10.5)
nRBC: 0 % (ref 0.0–0.2)

## 2018-07-08 ENCOUNTER — Non-Acute Institutional Stay (SKILLED_NURSING_FACILITY): Payer: Medicare Other | Admitting: Adult Health

## 2018-07-08 ENCOUNTER — Encounter: Payer: Self-pay | Admitting: Adult Health

## 2018-07-08 DIAGNOSIS — K5904 Chronic idiopathic constipation: Secondary | ICD-10-CM

## 2018-07-08 DIAGNOSIS — F0150 Vascular dementia without behavioral disturbance: Secondary | ICD-10-CM | POA: Diagnosis not present

## 2018-07-08 DIAGNOSIS — I10 Essential (primary) hypertension: Secondary | ICD-10-CM

## 2018-07-08 DIAGNOSIS — S72002S Fracture of unspecified part of neck of left femur, sequela: Secondary | ICD-10-CM | POA: Diagnosis not present

## 2018-07-08 NOTE — Progress Notes (Signed)
[facility] Room Number: 208 B Place of Service:  SNF (31)   CODE STATUS: DNR  No Known Allergies  Chief Complaint  Patient presents with  . Medical Management of Chronic Issues    Essential hypertension benign; vascular dementia without behavioral disturbance; chronic idiopathic constipation; closed fracture of left hip; sequela. Weekly follow up for the first 30 days post hospitalization.     HPI:  She is a 84 year old short term rehab patient being seen for the management of her chronic illnesses: hypertension; dementia; constipation; left hip fracture. She is doing better since coming off her oxycodone; she is alert and talkative. She is unable to fully participate in the hpi or ros but does deny any right hip pain there are no reports of changes in appetite; no anxiety or insomnia.    Past Medical History:  Diagnosis Date  . Hypertension   . Knee pain     Past Surgical History:  Procedure Laterality Date  . HIP ARTHROPLASTY Left 06/14/2018   Procedure: ARTHROPLASTY BIPOLAR HIP (HEMIARTHROPLASTY);  Surgeon: Kennedy BuckerMenz, Michael, MD;  Location: [facility] ORS;  Service: Orthopedics;  Laterality: Left;  . NO PAST SURGERIES      Social History   Socioeconomic History  . Marital status: Widowed    Spouse name: Not on file  . Number of children: Not on file  . Years of education: Not on file  . Highest education level: Not on file  Occupational History  . Not on file  Social Needs  . Financial resource strain: Not on file  . Food insecurity:    Worry: Not on file    Inability: Not on file  . Transportation needs:    Medical: Not on file    Non-medical: Not on file  Tobacco Use  . Smoking status: Never Smoker  . Smokeless tobacco: Never Used  Substance and Sexual Activity  . Alcohol use: Never    Frequency: Never  . Drug use: Never  . Sexual activity: Not on file  Lifestyle  . Physical activity:    Days per week: Not on file   Minutes per session: Not on file  . Stress: Not on file  Relationships  . Social connections:    Talks on phone: Not on file    Gets together: Not on file    Attends religious service: Not on file    Active member of club or organization: Not on file    Attends meetings of clubs or organizations: Not on file    Relationship status: Not on file  . Intimate partner violence:    Fear of current or ex partner: Not on file    Emotionally abused: Not on file    Physically abused: Not on file    Forced sexual activity: Not on file  Other Topics Concern  . Not on file  Social History Narrative  . Not on file   Family History  Problem Relation Age of Onset  . Other Mother        Influenza      VITAL SIGNS BP 118/65   Pulse 72   Temp 97.6 F (36.4 C)   Resp 16   Ht 5\' 2"  (1.575 m)   Wt 125 lb (56.7 kg)   SpO2 97%   BMI 22.86 kg/m   Outpatient Encounter Medications as of 07/08/2018  Medication Sig  . acetaminophen (TYLENOL) 500 MG tablet Take 1,000 mg by mouth 3 (three)  times daily.   Marland Kitchen aspirin 325 MG tablet Take 325 mg by mouth 2 (two) times daily.  Marland Kitchen atenolol (TENORMIN) 25 MG tablet Take 12.5 mg by mouth daily.  Marland Kitchen docusate sodium (COLACE) 100 MG capsule Take 100 mg by mouth 2 (two) times daily as needed for mild constipation.  . Infant Care Products [facility] EX) Apply liberal amount topically to area of skin irritation as needed.  Ok to leave at bedside  . NON FORMULARY Diet Type: Regular  . Nutritional Supplements (ENSURE ENLIVE PO) Take 1 Bottle by mouth 2 (two) times daily.  . polyethylene glycol (MIRALAX / GLYCOLAX) packet Take 17 g by mouth daily. Hold , if loose stool or diarrhea  . senna (SENOKOT) 8.6 MG TABS tablet Take 1 tablet by mouth 2 (two) times daily as needed.    No facility-administered encounter medications on file as of 07/08/2018.      SIGNIFICANT DIAGNOSTIC EXAMS  PREVIOUS:   06-13-18: pelvic with left hip x-ray:  1. Varus angulated  subcapital left femoral neck fracture with bilateral hip joint osteoarthritis. 2. Facet arthrosis L5-S1 bilaterally. 3. No acute pelvic fracture.  06-13-18: chest x-ray: Cardiomegaly with tortuous atherosclerotic aorta. No active pulmonary disease.  06-13-18: right shoulder x-ray: Degenerative changes without acute abnormality.  NO NEW EXAMS.   LABS REVIEWED PREVIOUS:   06-13-18: wbc 12.4; hgb 12.1; hct 37.2; mcv 96.;6 plt 225; glucose 140; bun 29; creat 1.01; k+ 4.2; na++ 137; ca 9.2 hgb a1c 5.2 06-14-18: wbc 12.3; hgb 11.8; hct 35.4; mcv 95.7 plt 196 glucose 117; bun 24; creat 0.95; k+ 3.3; na++ 137; ca 8.9  06-15-18: glucose 105; bun 26; creat 0.96; k+ 3.8; na++ 136; ca 8.0  urine culture: 30,000 colonies e-coli  06-16-18: wbc 10.0; hgb 9.1; hct 28.1; mcv 98.6; plt 134 06-20-18: wbc 13.1; hgb 9.5; hct 29.3; mcv 96.1; plt 303   TODAY:   07-03-18: glucose  87; bun 23; creat 1.09; k+ 4.3; na++ 138; ca 8.6 07-04-18: wbc 9.8; hgb 9.7; hc5 31.4; mcv 98.1; plt 309    Review of Systems  Unable to perform ROS: Dementia (confusion)   Physical Exam  Constitutional: No distress.  Cachexia   Neck: No thyromegaly present.  Cardiovascular: Normal rate, regular rhythm, normal heart sounds and intact distal pulses.  Pulmonary/Chest: Effort normal and breath sounds normal. No respiratory distress.  Abdominal: Soft. Bowel sounds are normal. She exhibits no distension. There is no tenderness.  Musculoskeletal: She exhibits no edema.  Is able to move all extremities Is status post left femoral neck fracture     Lymphadenopathy:    She has no cervical adenopathy.  Neurological: She is alert.  Skin: Skin is warm and dry. She is not diaphoretic.  Psychiatric: She has a normal mood and affect.     ASSESSMENT/ PLAN:  TODAY:   1. Essential benign hypertension: is stable b/p 118/65; will continue atenolol 12.5 mg daily unable to tolerate higher doses due to bradycardia  2. Chronic idiopathic  constipation; is stable will continue miralax daily and senna twice daily   3. Closed left hip fracture: is stable will continue therapy as directed and will follow up with orthopedics; will continue tylenol 1 gm three times daily asa 325 mg twice  daily cannot tolerate oxycodone   4. Vascular dementia without behavioral disturbance: is without change weight is 125 (previous122) pounds; will monitor her status.      MD is aware of resident's narcotic use and is in agreement with current  plan of care. We will attempt to wean resident as apropriate   Ok Edwards NP [facility]  Contact [phone] Monday through Friday 8am- 5pm  After hours call [phone]

## 2018-07-14 ENCOUNTER — Non-Acute Institutional Stay (SKILLED_NURSING_FACILITY): Payer: Medicare Other | Admitting: Adult Health

## 2018-07-14 ENCOUNTER — Encounter: Payer: Self-pay | Admitting: Adult Health

## 2018-07-14 DIAGNOSIS — F0150 Vascular dementia without behavioral disturbance: Secondary | ICD-10-CM | POA: Diagnosis not present

## 2018-07-14 DIAGNOSIS — I10 Essential (primary) hypertension: Secondary | ICD-10-CM | POA: Diagnosis not present

## 2018-07-14 DIAGNOSIS — S72002S Fracture of unspecified part of neck of left femur, sequela: Secondary | ICD-10-CM | POA: Diagnosis not present

## 2018-07-14 NOTE — Progress Notes (Signed)
[facility]   [facility] Room Number: 208 A Place of Service:  SNF (31)    CODE STATUS: DNR  No Known Allergies  Chief Complaint  Patient presents with  . Discharge Note    Discharging to [facility]    HPI:  She is being discharged to assisted living with hospice care. Hospice will provide all needed dme. She had been hospitalized for a left hip fracture. She is unable to return back home. She will not need any further therapy at this time. The assisted living will provide medications; and will be followed up by the medical provider at that facility.    Past Medical History:  Diagnosis Date  . Hypertension   . Knee pain     Past Surgical History:  Procedure Laterality Date  . HIP ARTHROPLASTY Left 06/14/2018   Procedure: ARTHROPLASTY BIPOLAR HIP (HEMIARTHROPLASTY);  Surgeon: Kennedy BuckerMenz, Michael, MD;  Location: [facility] ORS;  Service: Orthopedics;  Laterality: Left;  . NO PAST SURGERIES      Social History   Socioeconomic History  . Marital status: Widowed    Spouse name: Not on file  . Number of children: Not on file  . Years of education: Not on file  . Highest education level: Not on file  Occupational History  . Not on file  Social Needs  . Financial resource strain: Not on file  . Food insecurity:    Worry: Not on file    Inability: Not on file  . Transportation needs:    Medical: Not on file    Non-medical: Not on file  Tobacco Use  . Smoking status: Never Smoker  . Smokeless tobacco: Never Used  Substance and Sexual Activity  . Alcohol use: Never    Frequency: Never  . Drug use: Never  . Sexual activity: Not on file  Lifestyle  . Physical activity:    Days per week: Not on file    Minutes per session: Not on file  . Stress: Not on file  Relationships  . Social connections:    Talks on phone: Not on file    Gets together: Not on file    Attends religious service: Not on file    Active member of club or organization: Not on file     Attends meetings of clubs or organizations: Not on file    Relationship status: Not on file  . Intimate partner violence:    Fear of current or ex partner: Not on file    Emotionally abused: Not on file    Physically abused: Not on file    Forced sexual activity: Not on file  Other Topics Concern  . Not on file  Social History Narrative  . Not on file   Family History  Problem Relation Age of Onset  . Other Mother        Influenza    VITAL SIGNS BP (!) 161/87   Pulse 84   Temp 97.7 F (36.5 C)   Resp 18   Ht 5\' 2"  (1.575 m)   Wt 125 lb (56.7 kg)   SpO2 97%   BMI 22.86 kg/m   Patient's Medications  New Prescriptions   No medications on file  Previous Medications   ACETAMINOPHEN (TYLENOL) 500 MG TABLET    Take 1,000 mg by mouth 3 (three) times daily.    ASPIRIN 325 MG TABLET    Take 325 mg by mouth 2 (two) times daily.   ATENOLOL (TENORMIN) 25 MG  TABLET    Take 12.5 mg by mouth daily.   DOCUSATE SODIUM (COLACE) 100 MG CAPSULE    Take 100 mg by mouth 2 (two) times daily as needed for mild constipation.   INFANT CARE PRODUCTS (DERMACLOUD EX)    Apply liberal amount topically to area of skin irritation as needed.  Ok to leave at bedside   NON FORMULARY    Diet Type: Regular   NUTRITIONAL SUPPLEMENTS (ENSURE ENLIVE PO)    Take 1 Bottle by mouth 2 (two) times daily.   POLYETHYLENE GLYCOL (MIRALAX / GLYCOLAX) PACKET    Take 17 g by mouth daily. Hold , if loose stool or diarrhea   SENNA (SENOKOT) 8.6 MG TABS TABLET    Take 1 tablet by mouth 2 (two) times daily as needed.   Modified Medications   No medications on file  Discontinued Medications   No medications on file     SIGNIFICANT DIAGNOSTIC EXAMS  PREVIOUS:   06-13-18: pelvic with left hip x-ray:  1. Varus angulated subcapital left femoral neck fracture with bilateral hip joint osteoarthritis. 2. Facet arthrosis L5-S1 bilaterally. 3. No acute pelvic fracture.  06-13-18: chest x-ray: Cardiomegaly with tortuous  atherosclerotic aorta. No active pulmonary disease.  06-13-18: right shoulder x-ray: Degenerative changes without acute abnormality.  NO NEW EXAMS.   LABS REVIEWED PREVIOUS:   06-13-18: wbc 12.4; hgb 12.1; hct 37.2; mcv 96.;6 plt 225; glucose 140; bun 29; creat 1.01; k+ 4.2; na++ 137; ca 9.2 hgb a1c 5.2 06-14-18: wbc 12.3; hgb 11.8; hct 35.4; mcv 95.7 plt 196 glucose 117; bun 24; creat 0.95; k+ 3.3; na++ 137; ca 8.9  06-15-18: glucose 105; bun 26; creat 0.96; k+ 3.8; na++ 136; ca 8.0  urine culture: 30,000 colonies e-coli  06-16-18: wbc 10.0; hgb 9.1; hct 28.1; mcv 98.6; plt 134 06-20-18: wbc 13.1; hgb 9.5; hct 29.3; mcv 96.1; plt 303 07-03-18: glucose  87; bun 23; creat 1.09; k+ 4.3; na++ 138; ca 8.6 07-04-18: wbc 9.8; hgb 9.7; hc5 31.4; mcv 98.1; plt 309   NO NEW LABS.    Review of Systems  Unable to perform ROS: Dementia (confusion )    Physical Exam  Constitutional: No distress.  Cachexia  Neck: No thyromegaly present.  Cardiovascular: Normal rate, regular rhythm, normal heart sounds and intact distal pulses.  Pulmonary/Chest: Effort normal and breath sounds normal. No respiratory distress.  Abdominal: Soft. Bowel sounds are normal. She exhibits no distension. There is no tenderness.  Musculoskeletal: She exhibits no edema.  Is able to move all extremities Is status post left femoral neck fracture      Lymphadenopathy:    She has no cervical adenopathy.  Neurological: She is alert.  Skin: Skin is warm and dry. She is not diaphoretic.  Psychiatric: She has a normal mood and affect.    ASSESSMENT/ PLAN:   Patient is being discharged with the following home health services:  Non needed will be followed by hospice care.   Patient is being discharged with the following durable medical equipment:  None needed.   Patient has been advised to f/u with their PCP in 1-2 weeks to bring them up to date on their rehab stay.  Social services at facility was responsible for arranging  this appointment.  Pt was provided with a 30 day supply of prescriptions for medications and refills must be obtained from their PCP.  For controlled substances, a more limited supply may be provided adequate until PCP appointment only.   The receiving  pharmacy will use the [facility] form for her medications she has no narcotic prescriptions.    Synthia Innocent NP [facility]  Contact [phone] Monday through Friday 8am- 5pm  After hours call [phone]

## 2018-08-01 ENCOUNTER — Emergency Department

## 2018-08-01 ENCOUNTER — Emergency Department
Admission: EM | Admit: 2018-08-01 | Discharge: 2018-08-02 | Disposition: A | Discharge status: Skilled Nursing Facility | Attending: Emergency Medicine | Admitting: Emergency Medicine

## 2018-08-01 DIAGNOSIS — Y92128 Other place in nursing home as the place of occurrence of the external cause: Secondary | ICD-10-CM | POA: Diagnosis not present

## 2018-08-01 DIAGNOSIS — F0150 Vascular dementia without behavioral disturbance: Secondary | ICD-10-CM | POA: Diagnosis not present

## 2018-08-01 DIAGNOSIS — Y999 Unspecified external cause status: Secondary | ICD-10-CM | POA: Diagnosis not present

## 2018-08-01 DIAGNOSIS — Z7982 Long term (current) use of aspirin: Secondary | ICD-10-CM | POA: Diagnosis not present

## 2018-08-01 DIAGNOSIS — S0001XA Abrasion of scalp, initial encounter: Secondary | ICD-10-CM | POA: Insufficient documentation

## 2018-08-01 DIAGNOSIS — N3000 Acute cystitis without hematuria: Secondary | ICD-10-CM | POA: Diagnosis not present

## 2018-08-01 DIAGNOSIS — I10 Essential (primary) hypertension: Secondary | ICD-10-CM | POA: Insufficient documentation

## 2018-08-01 DIAGNOSIS — S0990XA Unspecified injury of head, initial encounter: Secondary | ICD-10-CM | POA: Diagnosis present

## 2018-08-01 DIAGNOSIS — W19XXXA Unspecified fall, initial encounter: Secondary | ICD-10-CM | POA: Insufficient documentation

## 2018-08-01 DIAGNOSIS — Z23 Encounter for immunization: Secondary | ICD-10-CM | POA: Diagnosis not present

## 2018-08-01 DIAGNOSIS — Y939 Activity, unspecified: Secondary | ICD-10-CM | POA: Diagnosis not present

## 2018-08-01 DIAGNOSIS — Z79899 Other long term (current) drug therapy: Secondary | ICD-10-CM | POA: Diagnosis not present

## 2018-08-01 MED ORDER — FENTANYL CITRATE (PF) 100 MCG/2ML IJ SOLN
50.0000 ug | Freq: Once | INTRAMUSCULAR | Status: AC
  Administered 2018-08-01: 50 ug via INTRAVENOUS
  Filled 2018-08-01: qty 2

## 2018-08-01 MED ORDER — TETANUS-DIPHTH-ACELL PERTUSSIS 5-2.5-18.5 LF-MCG/0.5 IM SUSP
0.5000 mL | Freq: Once | INTRAMUSCULAR | Status: AC
  Administered 2018-08-01: 0.5 mL via INTRAMUSCULAR
  Filled 2018-08-01: qty 0.5

## 2018-08-01 MED ORDER — LIDOCAINE-EPINEPHRINE 2 %-1:100000 IJ SOLN
20.0000 mL | Freq: Once | INTRAMUSCULAR | Status: DC
  Filled 2018-08-01: qty 1

## 2018-08-01 NOTE — ED Provider Notes (Signed)
[facility] Emergency Department Provider Note  ____________________________________________   First MD Initiated Contact with Patient 08/01/18 2306     (approximate)  I have reviewed the triage vital signs and the nursing notes.   HISTORY  Chief Complaint Fall  Level 5 exemption history is limited by the patient's confusion  HPI Jill Henderson is a 84 y.o. female who comes to the emergency department via EMS from her assisted living facility after an unwitnessed fall.  The patient has an abrasion to the left back of her head.  She does take daily aspirin which was the concern.  The patient herself does not remember what happened.  She is not sure if she tripped or passed out.  She does have a moderate severity headache.  Denies chest pain or shortness of breath.  Denies abdominal pain nausea or vomiting.    Past Medical History:  Diagnosis Date  . Hypertension   . Knee pain     Patient Active Problem List   Diagnosis Date Noted  . Vascular dementia without behavioral disturbance (HCC) 07/04/2018  . Essential hypertension, benign 06/21/2018  . UTI (urinary tract infection) 06/21/2018  . Chronic idiopathic constipation 06/21/2018  . Closed left hip fracture (HCC) 06/13/2018    Past Surgical History:  Procedure Laterality Date  . HIP ARTHROPLASTY Left 06/14/2018   Procedure: ARTHROPLASTY BIPOLAR HIP (HEMIARTHROPLASTY);  Surgeon: Kennedy BuckerMenz, Michael, MD;  Location: [facility] ORS;  Service: Orthopedics;  Laterality: Left;  . NO PAST SURGERIES      Prior to Admission medications   Medication Sig Start Date End Date Taking? Authorizing Provider  acetaminophen (TYLENOL) 500 MG tablet Take 1,000 mg by mouth 3 (three) times daily.  07/02/18  Yes [provider]  alum & mag hydroxide-simeth (MAALOX/MYLANTA) 200-200-20 MG/5ML suspension Take 30 mLs by mouth every 6 (six) hours as needed for indigestion or heartburn.   Yes [provider]  aspirin 325  MG tablet Take 325 mg by mouth 2 (two) times daily. 06/18/18  Yes [provider]  atenolol (TENORMIN) 25 MG tablet Take 12.5 mg by mouth daily. 06/19/18  Yes [provider]  docusate sodium (COLACE) 100 MG capsule Take 100 mg by mouth 2 (two) times daily as needed for mild constipation. 06/24/18  Yes [provider]  Nutritional Supplements (ENSURE ENLIVE PO) Take 1 Bottle by mouth 2 (two) times daily. 07/02/18  Yes [provider]  polyethylene glycol (MIRALAX / GLYCOLAX) packet Take 17 g by mouth daily. Hold , if loose stool or diarrhea 06/18/18  Yes Altamese DillingVachhani, Vaibhavkumar, MD  senna (SENOKOT) 8.6 MG TABS tablet Take 1 tablet by mouth 2 (two) times daily as needed.  06/24/18  Yes [provider]  cephALEXin (KEFLEX) 250 MG capsule Take 1 capsule (250 mg total) by mouth 3 (three) times daily for 5 days. 08/02/18 08/07/18  Merrily Brittleifenbark, Romen Yutzy, MD  Infant Care Products [facility](DERMACLOUD EX) Apply liberal amount topically to area of skin irritation as needed.  Ok to leave at bedside 06/20/18   [provider]  NON FORMULARY Diet Type: Regular    [provider]    Allergies Patient has no known allergies.  Family History  Problem Relation Age of Onset  . Other Mother        Influenza    Social History Social History   Tobacco Use  . Smoking status: Never Smoker  . Smokeless tobacco: Never Used  Substance Use Topics  . Alcohol use: Never  Frequency: Never  . Drug use: Never    Review of Systems Level 5 exemption history is limited by the patient's dementia  ____________________________________________   PHYSICAL EXAM:  VITAL SIGNS: ED Triage Vitals  Enc Vitals Group     BP      Pulse      Resp      Temp      Temp src      SpO2      Weight      Height      Head Circumference      Peak Flow      Pain Score      Pain Loc      Pain Edu?      Excl. in GC?     Constitutional: Pleasant and cooperative although  appears obviously somewhat uncomfortable Eyes: PERRL EOMI. midrange and brisk Head: Abrasion to high left occiput. Nose: No congestion/rhinnorhea. Mouth/Throat: No trismus Neck: No stridor.  No midline tenderness or step-offs Cardiovascular: Normal rate, regular rhythm. Grossly normal heart sounds.  Good peripheral circulation. Respiratory: Normal respiratory effort.  No retractions. Lungs CTAB and moving good air Gastrointestinal: Soft nontender Musculoskeletal: No lower extremity edema   Neurologic:  Normal speech and language. No gross focal neurologic deficits are appreciated. Skin:  Skin is warm, dry and intact. No rash noted. Psychiatric: Cooperative but with obvious dementia    ____________________________________________   DIFFERENTIAL includes but not limited to  Mechanical fall, syncope, intracerebral hemorrhage, cervical spine fracture, laceration, urinary tract infection ____________________________________________   LABS (all labs ordered are listed, but only abnormal results are displayed)  Labs Reviewed  URINE CULTURE - Abnormal; Notable for the following components:      Result Value   Culture >=100,000 COLONIES/mL PSEUDOMONAS AERUGINOSA (*)    Organism ID, Bacteria PSEUDOMONAS AERUGINOSA (*)    All other components within normal limits  COMPREHENSIVE METABOLIC PANEL - Abnormal; Notable for the following components:   Sodium 134 (*)    Glucose, Bld 105 (*)    BUN 31 (*)    Creatinine, Ser 1.15 (*)    Calcium 8.5 (*)    Total Protein 6.4 (*)    Albumin 3.0 (*)    GFR calc non Af Amer 39 (*)    GFR calc Af Amer 46 (*)    All other components within normal limits  TROPONIN I - Abnormal; Notable for the following components:   Troponin I 0.07 (*)    All other components within normal limits  CBC WITH DIFFERENTIAL/PLATELET - Abnormal; Notable for the following components:   RBC 3.11 (*)    Hemoglobin 9.4 (*)    HCT 30.5 (*)    RDW 16.9 (*)    All other  components within normal limits  URINALYSIS, COMPLETE (UACMP) WITH MICROSCOPIC - Abnormal; Notable for the following components:   Color, Urine YELLOW (*)    APPearance HAZY (*)    Hgb urine dipstick SMALL (*)    Protein, ur 30 (*)    Nitrite POSITIVE (*)    Leukocytes, UA MODERATE (*)    WBC, UA >50 (*)    Bacteria, UA RARE (*)    All other components within normal limits  TROPONIN I - Abnormal; Notable for the following components:   Troponin I 0.07 (*)    All other components within normal limits    Lab work reviewed by me consistent with urinary tract infection.  Slightly elevated troponin is stable and of unclear clinical significance  __________________________________________  EKG  ED ECG REPORT I, Merrily BrittleNeil Rieley Khalsa, the attending physician, personally viewed and interpreted this ECG.  Date: 08/01/2018 EKG Time: 2313 Rate: 64 Rhythm: normal sinus rhythm QRS Axis: Leftward axis Intervals: normal ST/T Wave abnormalities: normal Narrative Interpretation: no evidence of acute ischemia  _____ED ECG REPORT I, Merrily BrittleNeil Brennah Quraishi, the attending physician, personally viewed and interpreted this ECG.  Date: 08/02/2018 EKG Time: 0041 Rate: 60 Rhythm: normal sinus rhythm QRS Axis: Leftward axis Intervals: normal ST/T Wave abnormalities: normal Narrative Interpretation: no evidence of acute ischemia.  Unchanged EKG from previous EKG an hour and a half ago _______________________________________  RADIOLOGY  Head and neck CTs reviewed by me with no acute traumatic injury noted Chest x-ray reviewed by me with no acute disease ____________________________________________   PROCEDURES  Procedure(s) performed: no  Procedures  Critical Care performed: no  ____________________________________________   INITIAL IMPRESSION / ASSESSMENT AND PLAN / ED COURSE  Pertinent labs & imaging results that were available during my care of the patient were reviewed by me and considered  in my medical decision making (see chart for details).   As part of my medical decision making, I reviewed the following data within the electronic MEDICAL RECORD NUMBER History obtained from family if available, nursing notes, old chart and ekg, as well as notes from prior ED visits.  The patient comes to the emergency department with obvious posterior head trauma of unclear etiology.  The patient does not remember falling.  Given her advanced age and blood thinners I have CT her head and her neck which are fortunately negative for acute pathology.  Her EKG is abnormal although unchanged from previous and serial EKGs are unchanged.  Her troponin is slightly elevated although stable on repeat testing.  Her urinalysis is consistent with infection so I sent off a culture and will treat her with a gram of ceftriaxone.  On chart review the patient has never had a resistant infection so I will discharge her with Keflex for 5 days.  She has no wounds requiring repair.  She is discharged home in improved condition.      ____________________________________________   FINAL CLINICAL IMPRESSION(S) / ED DIAGNOSES  Final diagnoses:  Acute cystitis without hematuria  Fall, initial encounter  Abrasion of scalp, initial encounter      NEW MEDICATIONS STARTED DURING THIS VISIT:  Discharge Medication List as of 08/02/2018  4:52 AM    START taking these medications   Details  cephALEXin (KEFLEX) 250 MG capsule Take 1 capsule (250 mg total) by mouth 3 (three) times daily for 5 days., Starting Sat 08/02/2018, Until Thu 08/07/2018, Print         Note:  This document was prepared using Dragon voice recognition software and may include unintentional dictation errors.    Merrily Brittleifenbark, Saivon Prowse, MD 08/04/18 [phone]

## 2018-08-01 NOTE — ED Triage Notes (Addendum)
Patient coming [facility] from [facility], for unwitnessed fall. Patient has abrasion to back of head. Patient on blood thinners.   EMS vitals: ' 173/81, HR: 72, 97% on RA

## 2018-08-02 LAB — CBC WITH DIFFERENTIAL/PLATELET
Abs Immature Granulocytes: 0.03 10*3/uL (ref 0.00–0.07)
BASOS ABS: 0.1 10*3/uL (ref 0.0–0.1)
BASOS PCT: 1 %
EOS PCT: 4 %
Eosinophils Absolute: 0.4 10*3/uL (ref 0.0–0.5)
HCT: 30.5 % — ABNORMAL LOW (ref 36.0–46.0)
HEMOGLOBIN: 9.4 g/dL — AB (ref 12.0–15.0)
Immature Granulocytes: 0 %
LYMPHS PCT: 32 %
Lymphs Abs: 2.7 10*3/uL (ref 0.7–4.0)
MCH: 30.2 pg (ref 26.0–34.0)
MCHC: 30.8 g/dL (ref 30.0–36.0)
MCV: 98.1 fL (ref 80.0–100.0)
MONO ABS: 0.7 10*3/uL (ref 0.1–1.0)
Monocytes Relative: 9 %
NEUTROS ABS: 4.5 10*3/uL (ref 1.7–7.7)
NRBC: 0 % (ref 0.0–0.2)
Neutrophils Relative %: 54 %
Platelets: 298 10*3/uL (ref 150–400)
RBC: 3.11 MIL/uL — AB (ref 3.87–5.11)
RDW: 16.9 % — ABNORMAL HIGH (ref 11.5–15.5)
WBC: 8.4 10*3/uL (ref 4.0–10.5)

## 2018-08-02 LAB — URINALYSIS, COMPLETE (UACMP) WITH MICROSCOPIC
Bilirubin Urine: NEGATIVE
GLUCOSE, UA: NEGATIVE mg/dL
KETONES UR: NEGATIVE mg/dL
NITRITE: POSITIVE — AB
PH: 5 (ref 5.0–8.0)
Protein, ur: 30 mg/dL — AB
SPECIFIC GRAVITY, URINE: 1.025 (ref 1.005–1.030)
WBC, UA: >50 WBC/hpf — ABNORMAL HIGH (ref 0–5)

## 2018-08-02 LAB — COMPREHENSIVE METABOLIC PANEL
ALT: 9 U/L (ref 0–44)
ANION GAP: 8 (ref 5–15)
AST: 20 U/L (ref 15–41)
Albumin: 3 g/dL — ABNORMAL LOW (ref 3.5–5.0)
Alkaline Phosphatase: 68 U/L (ref 38–126)
BUN: 31 mg/dL — ABNORMAL HIGH (ref 8–23)
CALCIUM: 8.5 mg/dL — AB (ref 8.9–10.3)
CHLORIDE: 103 mmol/L (ref 98–111)
CO2: 23 mmol/L (ref 22–32)
CREATININE: 1.15 mg/dL — AB (ref 0.44–1.00)
GFR, EST AFRICAN AMERICAN: 46 mL/min — AB (ref >60)
GFR, EST NON AFRICAN AMERICAN: 39 mL/min — AB (ref >60)
Glucose, Bld: 105 mg/dL — ABNORMAL HIGH (ref 70–99)
Potassium: 4.9 mmol/L (ref 3.5–5.1)
SODIUM: 134 mmol/L — AB (ref 135–145)
Total Bilirubin: 0.5 mg/dL (ref 0.3–1.2)
Total Protein: 6.4 g/dL — ABNORMAL LOW (ref 6.5–8.1)

## 2018-08-02 LAB — TROPONIN I
TROPONIN I: 0.07 ng/mL — AB (ref <0.03)
Troponin I: 0.07 ng/mL (ref <0.03)

## 2018-08-02 MED ORDER — SODIUM CHLORIDE 0.9 % IV SOLN
1.0000 g | Freq: Once | INTRAVENOUS | Status: AC
  Administered 2018-08-02: 1 g via INTRAVENOUS
  Filled 2018-08-02: qty 10

## 2018-08-02 MED ORDER — CEPHALEXIN 250 MG PO CAPS: 250.0000 mg | ORAL_CAPSULE | Freq: Three times a day (TID) | ORAL | 0 refills | Status: AC

## 2018-08-02 NOTE — ED Notes (Addendum)
CRITICAL LAB: TROPONIN is 0.07, ROBIN Lab, Dr. Lamont SnowballIFENBARK notified, orders received

## 2018-08-02 NOTE — ED Notes (Signed)
Reviewed discharge instructions, follow-up care, and prescriptions with patient and Drucilla from [facility]. Patient and Drucilla verbalized understanding of all information reviewed. Patient stable, with no distress noted at this time.

## 2018-08-02 NOTE — Discharge Instructions (Addendum)
It was a pleasure to take care of you today, and thank you for coming to our emergency department.  If you have any questions or concerns before leaving please ask the nurse to grab me and I'm more than happy to go through your aftercare instructions again.  If you have any concerns once you are home that you are not improving or are in fact getting worse before you can make it to your follow-up appointment, please do not hesitate to call [phone] and come back for further evaluation.  Merrily BrittleNeil Brendy Ficek, MD  Results for orders placed or performed during the hospital encounter of 08/01/18  Comprehensive metabolic panel  Result Value Ref Range   Sodium 134 (L) 135 - 145 mmol/L   Potassium 4.9 3.5 - 5.1 mmol/L   Chloride 103 98 - 111 mmol/L   CO2 23 22 - 32 mmol/L   Glucose, Bld 105 (H) 70 - 99 mg/dL   BUN 31 (H) 8 - 23 mg/dL   Creatinine, Ser 0.981.15 (H) 0.44 - 1.00 mg/dL   Calcium 8.5 (L) 8.9 - 10.3 mg/dL   Total Protein 6.4 (L) 6.5 - 8.1 g/dL   Albumin 3.0 (L) 3.5 - 5.0 g/dL   AST 20 15 - 41 U/L   ALT 9 0 - 44 U/L   Alkaline Phosphatase 68 38 - 126 U/L   Total Bilirubin 0.5 0.3 - 1.2 mg/dL   GFR calc non Af Amer 39 (L) >60 mL/min   GFR calc Af Amer 46 (L) >60 mL/min   Anion gap 8 5 - 15  Troponin I - Once  Result Value Ref Range   Troponin I 0.07 (HH) <0.03 ng/mL  CBC with Differential  Result Value Ref Range   WBC 8.4 4.0 - 10.5 K/uL   RBC 3.11 (L) 3.87 - 5.11 MIL/uL   Hemoglobin 9.4 (L) 12.0 - 15.0 g/dL   HCT 11.930.5 (L) 14.736.0 - 82.946.0 %   MCV 98.1 80.0 - 100.0 fL   MCH 30.2 26.0 - 34.0 pg   MCHC 30.8 30.0 - 36.0 g/dL   RDW 56.216.9 (H) 13.011.5 - 86.515.5 %   Platelets 298 150 - 400 K/uL   nRBC 0.0 0.0 - 0.2 %   Neutrophils Relative % 54 %   Neutro Abs 4.5 1.7 - 7.7 K/uL   Lymphocytes Relative 32 %   Lymphs Abs 2.7 0.7 - 4.0 K/uL   Monocytes Relative 9 %   Monocytes Absolute 0.7 0.1 - 1.0 K/uL   Eosinophils Relative 4 %   Eosinophils Absolute 0.4 0.0 - 0.5 K/uL   Basophils Relative 1 %   Basophils Absolute 0.1 0.0 - 0.1 K/uL   Immature Granulocytes 0 %   Abs Immature Granulocytes 0.03 0.00 - 0.07 K/uL  Urinalysis, Complete w Microscopic  Result Value Ref Range   Color, Urine YELLOW (A) YELLOW   APPearance HAZY (A) CLEAR   Specific Gravity, Urine 1.025 1.005 - 1.030   pH 5.0 5.0 - 8.0   Glucose, UA NEGATIVE NEGATIVE mg/dL   Hgb urine dipstick SMALL (A) NEGATIVE   Bilirubin Urine NEGATIVE NEGATIVE   Ketones, ur NEGATIVE NEGATIVE mg/dL   Protein, ur 30 (A) NEGATIVE mg/dL   Nitrite POSITIVE (A) NEGATIVE   Leukocytes, UA MODERATE (A) NEGATIVE   RBC / HPF 6-10 0 - 5 RBC/hpf   WBC, UA >50 (H) 0 - 5 WBC/hpf   Bacteria, UA RARE (A) NONE SEEN   Squamous Epithelial / LPF 0-5 0 - 5  WBC Clumps PRESENT    Mucus PRESENT   Troponin I - Once  Result Value Ref Range   Troponin I 0.07 (HH) <0.03 ng/mL   Ct Head Wo Contrast  Result Date: 08/01/2018 CLINICAL DATA:  Status post unwitnessed fall, with laceration at the back of the head. Patient on blood thinners. Concern for cervical spine injury. Initial encounter. EXAM: CT HEAD WITHOUT CONTRAST CT CERVICAL SPINE WITHOUT CONTRAST TECHNIQUE: Multidetector CT imaging of the head and cervical spine was performed following the standard protocol without intravenous contrast. Multiplanar CT image reconstructions of the cervical spine were also generated. COMPARISON:  None. FINDINGS: CT HEAD FINDINGS Brain: No evidence of acute infarction, hemorrhage, hydrocephalus, extra-axial collection or mass lesion / mass effect. Prominence of the ventricles and sulci reflects mild to moderate cortical volume loss. Cerebellar atrophy is noted. Diffuse periventricular and subcortical white matter change likely reflects small vessel ischemic microangiopathy. The brainstem and fourth ventricle are within normal limits. The basal ganglia are unremarkable in appearance. The cerebral hemispheres demonstrate grossly normal gray-white differentiation. No mass effect  or midline shift is seen. Vascular: No hyperdense vessel or unexpected calcification. Skull: There is no evidence of fracture; visualized osseous structures are unremarkable in appearance. Sinuses/Orbits: The orbits are within normal limits. The paranasal sinuses and mastoid air cells are well-aerated. Other: No significant soft tissue abnormalities are seen. CT CERVICAL SPINE FINDINGS Alignment: Normal. Skull base and vertebrae: No acute fracture. No primary bone lesion or focal pathologic process. Soft tissues and spinal canal: No prevertebral fluid or swelling. No visible canal hematoma. Disc levels: Mild multilevel disc space narrowing is noted along the mid to lower cervical spine, with mild grade 1 anterolisthesis of C7 on T1, reflecting underlying facet disease. Scattered posterior disc osteophyte complexes are noted. Upper chest: The visualized lung apices are clear. The visualized portions of the thyroid gland are grossly unremarkable. Dense calcification is noted at the carotid bifurcations bilaterally. Other: No additional soft tissue abnormalities are seen. IMPRESSION: 1. No evidence of traumatic intracranial injury or fracture. 2. No evidence of fracture or subluxation along the cervical spine. 3. Mild to moderate cortical volume loss and diffuse small vessel ischemic microangiopathy. 4. Mild degenerative change along the mid to lower cervical spine. 5. Dense calcification at the carotid bifurcations bilaterally. Carotid ultrasound would be helpful for further evaluation, when and as deemed clinically appropriate. Electronically Signed   By: Roanna RaiderJeffery  Chang M.D.   On: 08/01/2018 23:55   Ct Cervical Spine Wo Contrast  Result Date: 08/01/2018 CLINICAL DATA:  Status post unwitnessed fall, with laceration at the back of the head. Patient on blood thinners. Concern for cervical spine injury. Initial encounter. EXAM: CT HEAD WITHOUT CONTRAST CT CERVICAL SPINE WITHOUT CONTRAST TECHNIQUE: Multidetector CT  imaging of the head and cervical spine was performed following the standard protocol without intravenous contrast. Multiplanar CT image reconstructions of the cervical spine were also generated. COMPARISON:  None. FINDINGS: CT HEAD FINDINGS Brain: No evidence of acute infarction, hemorrhage, hydrocephalus, extra-axial collection or mass lesion / mass effect. Prominence of the ventricles and sulci reflects mild to moderate cortical volume loss. Cerebellar atrophy is noted. Diffuse periventricular and subcortical white matter change likely reflects small vessel ischemic microangiopathy. The brainstem and fourth ventricle are within normal limits. The basal ganglia are unremarkable in appearance. The cerebral hemispheres demonstrate grossly normal gray-white differentiation. No mass effect or midline shift is seen. Vascular: No hyperdense vessel or unexpected calcification. Skull: There is no evidence of fracture; visualized osseous  structures are unremarkable in appearance. Sinuses/Orbits: The orbits are within normal limits. The paranasal sinuses and mastoid air cells are well-aerated. Other: No significant soft tissue abnormalities are seen. CT CERVICAL SPINE FINDINGS Alignment: Normal. Skull base and vertebrae: No acute fracture. No primary bone lesion or focal pathologic process. Soft tissues and spinal canal: No prevertebral fluid or swelling. No visible canal hematoma. Disc levels: Mild multilevel disc space narrowing is noted along the mid to lower cervical spine, with mild grade 1 anterolisthesis of C7 on T1, reflecting underlying facet disease. Scattered posterior disc osteophyte complexes are noted. Upper chest: The visualized lung apices are clear. The visualized portions of the thyroid gland are grossly unremarkable. Dense calcification is noted at the carotid bifurcations bilaterally. Other: No additional soft tissue abnormalities are seen. IMPRESSION: 1. No evidence of traumatic intracranial injury or  fracture. 2. No evidence of fracture or subluxation along the cervical spine. 3. Mild to moderate cortical volume loss and diffuse small vessel ischemic microangiopathy. 4. Mild degenerative change along the mid to lower cervical spine. 5. Dense calcification at the carotid bifurcations bilaterally. Carotid ultrasound would be helpful for further evaluation, when and as deemed clinically appropriate. Electronically Signed   By: Roanna Raider M.D.   On: 08/01/2018 23:55   Dg Chest Port 1 View  Result Date: 08/01/2018 CLINICAL DATA:  Fall with chest pain EXAM: PORTABLE CHEST 1 VIEW COMPARISON:  06/13/2018 FINDINGS: No acute opacity or pleural effusion. Stable slightly enlarged cardiomediastinal silhouette with aortic atherosclerosis. No pneumothorax. IMPRESSION: No active disease.  Mild cardiomegaly Electronically Signed   By: Jasmine Pang M.D.   On: 08/01/2018 23:50

## 2018-08-04 LAB — URINE CULTURE: Culture: >=100000 — AB

## 2019-03-31 IMAGING — CR DG CHEST 1V PORT
1 series · 1 of 1 positions shown · non-contrast
Comparison: 06/13/2018

CLINICAL DATA: Fall with chest pain

EXAM:
PORTABLE CHEST 1 VIEW

[chest ap]
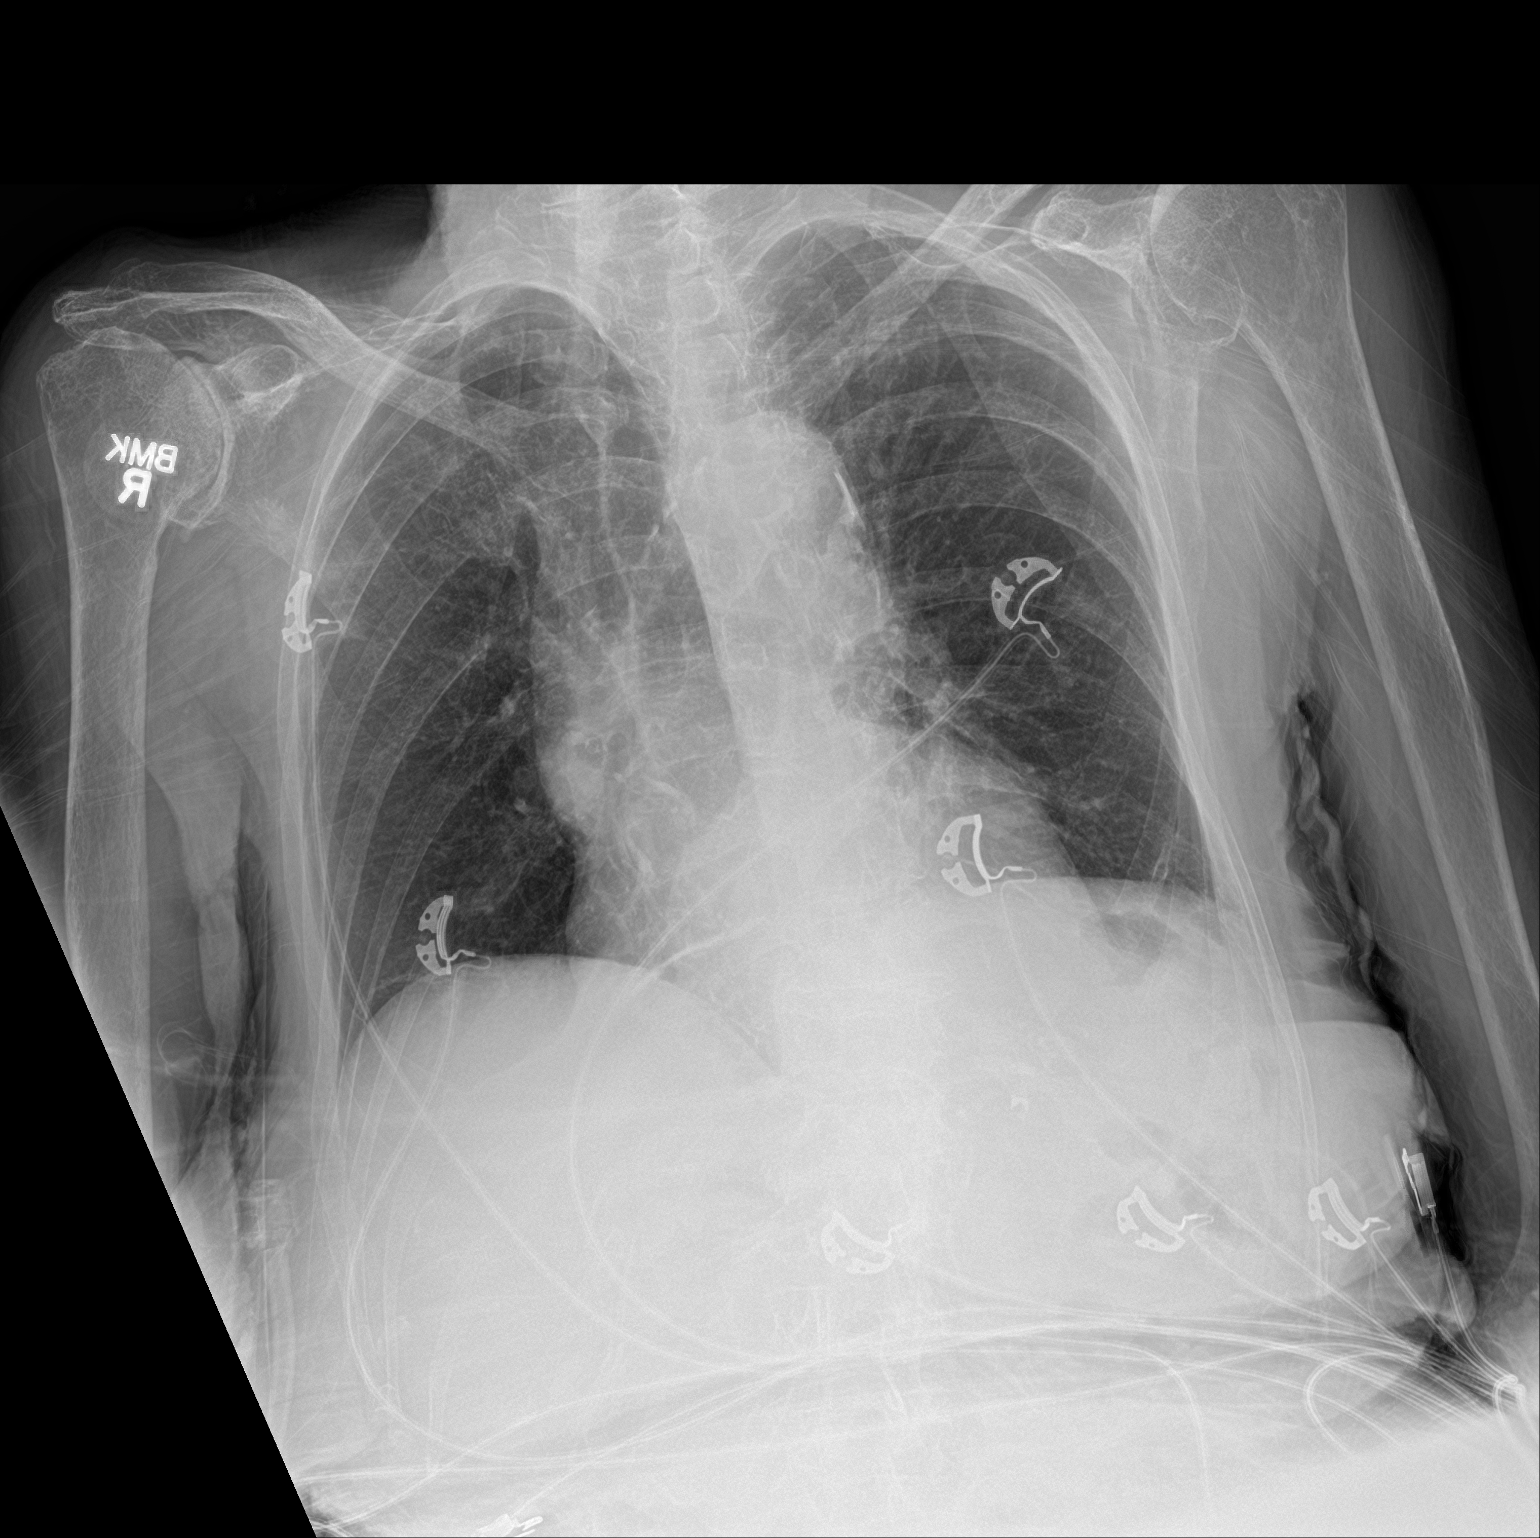

[1 of 1 positions shown; findings below may reference images not displayed]

FINDINGS: No acute opacity or pleural effusion. Stable slightly enlarged
cardiomediastinal silhouette with aortic atherosclerosis. No
pneumothorax.
IMPRESSION: No active disease.  Mild cardiomegaly

## 2019-04-14 DEATH — deceased
